# Patient Record
Sex: Female | Born: 1960 | Race: White | Hispanic: No | Marital: Married | Smoking: Never smoker
Health system: Southern US, Community
[De-identification: ages and names within clinical notes are randomized; demographics above are authoritative.]

## PROBLEM LIST (undated history)

## (undated) DIAGNOSIS — T4145XA Adverse effect of unspecified anesthetic, initial encounter: Secondary | ICD-10-CM

## (undated) DIAGNOSIS — C50919 Malignant neoplasm of unspecified site of unspecified female breast: Secondary | ICD-10-CM

## (undated) DIAGNOSIS — M8580 Other specified disorders of bone density and structure, unspecified site: Secondary | ICD-10-CM

## (undated) DIAGNOSIS — Z803 Family history of malignant neoplasm of breast: Secondary | ICD-10-CM

## (undated) DIAGNOSIS — E119 Type 2 diabetes mellitus without complications: Secondary | ICD-10-CM

## (undated) DIAGNOSIS — T8859XA Other complications of anesthesia, initial encounter: Secondary | ICD-10-CM

## (undated) DIAGNOSIS — M25559 Pain in unspecified hip: Secondary | ICD-10-CM

## (undated) DIAGNOSIS — J45909 Unspecified asthma, uncomplicated: Secondary | ICD-10-CM

## (undated) DIAGNOSIS — S52501A Unspecified fracture of the lower end of right radius, initial encounter for closed fracture: Secondary | ICD-10-CM

## (undated) DIAGNOSIS — E039 Hypothyroidism, unspecified: Secondary | ICD-10-CM

## (undated) DIAGNOSIS — F845 Asperger's syndrome: Secondary | ICD-10-CM

## (undated) DIAGNOSIS — M1990 Unspecified osteoarthritis, unspecified site: Secondary | ICD-10-CM

## (undated) DIAGNOSIS — G8929 Other chronic pain: Secondary | ICD-10-CM

## (undated) DIAGNOSIS — C801 Malignant (primary) neoplasm, unspecified: Secondary | ICD-10-CM

## (undated) DIAGNOSIS — E079 Disorder of thyroid, unspecified: Secondary | ICD-10-CM

## (undated) DIAGNOSIS — I10 Essential (primary) hypertension: Secondary | ICD-10-CM

## (undated) DIAGNOSIS — Z8041 Family history of malignant neoplasm of ovary: Secondary | ICD-10-CM

## (undated) DIAGNOSIS — F840 Autistic disorder: Secondary | ICD-10-CM

## (undated) DIAGNOSIS — E1140 Type 2 diabetes mellitus with diabetic neuropathy, unspecified: Secondary | ICD-10-CM

## (undated) DIAGNOSIS — E559 Vitamin D deficiency, unspecified: Secondary | ICD-10-CM

## (undated) DIAGNOSIS — M549 Dorsalgia, unspecified: Secondary | ICD-10-CM

## (undated) HISTORY — DX: Disorder of thyroid, unspecified: E07.9

## (undated) HISTORY — DX: Other specified disorders of bone density and structure, unspecified site: M85.80

## (undated) HISTORY — DX: Type 2 diabetes mellitus without complications: E11.9

## (undated) HISTORY — PX: CHOLECYSTECTOMY: SHX55

## (undated) HISTORY — DX: Malignant neoplasm of unspecified site of unspecified female breast: C50.919

## (undated) HISTORY — DX: Essential (primary) hypertension: I10

## (undated) HISTORY — DX: Family history of malignant neoplasm of breast: Z80.3

## (undated) HISTORY — DX: Vitamin D deficiency, unspecified: E55.9

## (undated) HISTORY — DX: Family history of malignant neoplasm of ovary: Z80.41

## (undated) HISTORY — PX: BREAST BIOPSY: SHX20

---

## 1998-02-23 ENCOUNTER — Other Ambulatory Visit: Admission: RE | Admit: 1998-02-23 | Discharge: 1998-02-23 | Payer: Self-pay | Admitting: Obstetrics & Gynecology

## 1998-07-30 ENCOUNTER — Encounter: Payer: Self-pay | Admitting: Emergency Medicine

## 1998-07-30 ENCOUNTER — Emergency Department (HOSPITAL_COMMUNITY): Admission: EM | Admit: 1998-07-30 | Discharge: 1998-07-30 | Payer: Self-pay | Admitting: *Deleted

## 1999-03-29 ENCOUNTER — Other Ambulatory Visit: Admission: RE | Admit: 1999-03-29 | Discharge: 1999-03-29 | Payer: Self-pay | Admitting: Obstetrics & Gynecology

## 2000-03-30 ENCOUNTER — Other Ambulatory Visit: Admission: RE | Admit: 2000-03-30 | Discharge: 2000-03-30 | Payer: Self-pay | Admitting: Obstetrics & Gynecology

## 2000-04-20 ENCOUNTER — Encounter: Admission: RE | Admit: 2000-04-20 | Discharge: 2000-04-20 | Payer: Self-pay | Admitting: Obstetrics & Gynecology

## 2000-04-20 ENCOUNTER — Encounter (INDEPENDENT_AMBULATORY_CARE_PROVIDER_SITE_OTHER): Payer: Self-pay | Admitting: Specialist

## 2000-04-20 ENCOUNTER — Encounter: Payer: Self-pay | Admitting: Obstetrics & Gynecology

## 2000-04-20 ENCOUNTER — Other Ambulatory Visit: Admission: RE | Admit: 2000-04-20 | Discharge: 2000-04-20 | Payer: Self-pay | Admitting: Obstetrics & Gynecology

## 2000-04-22 ENCOUNTER — Emergency Department (HOSPITAL_COMMUNITY): Admission: EM | Admit: 2000-04-22 | Discharge: 2000-04-22 | Payer: Self-pay | Admitting: Emergency Medicine

## 2001-04-23 ENCOUNTER — Other Ambulatory Visit: Admission: RE | Admit: 2001-04-23 | Discharge: 2001-04-23 | Payer: Self-pay | Admitting: Obstetrics & Gynecology

## 2001-05-28 ENCOUNTER — Emergency Department (HOSPITAL_COMMUNITY): Admission: EM | Admit: 2001-05-28 | Discharge: 2001-05-28 | Payer: Self-pay

## 2002-08-31 ENCOUNTER — Other Ambulatory Visit: Admission: RE | Admit: 2002-08-31 | Discharge: 2002-08-31 | Payer: Self-pay | Admitting: Obstetrics & Gynecology

## 2004-03-06 ENCOUNTER — Emergency Department (HOSPITAL_COMMUNITY): Admission: EM | Admit: 2004-03-06 | Discharge: 2004-03-06 | Payer: Self-pay | Admitting: Emergency Medicine

## 2004-04-02 ENCOUNTER — Ambulatory Visit (HOSPITAL_COMMUNITY): Admission: RE | Admit: 2004-04-02 | Discharge: 2004-04-03 | Payer: Self-pay | Admitting: General Surgery

## 2004-04-02 ENCOUNTER — Encounter (INDEPENDENT_AMBULATORY_CARE_PROVIDER_SITE_OTHER): Payer: Self-pay | Admitting: *Deleted

## 2005-03-26 ENCOUNTER — Other Ambulatory Visit: Admission: RE | Admit: 2005-03-26 | Discharge: 2005-03-26 | Payer: Self-pay | Admitting: Internal Medicine

## 2005-04-10 ENCOUNTER — Encounter: Admission: RE | Admit: 2005-04-10 | Discharge: 2005-04-10 | Payer: Self-pay | Admitting: Internal Medicine

## 2006-05-29 ENCOUNTER — Encounter: Admission: RE | Admit: 2006-05-29 | Discharge: 2006-05-29 | Payer: Self-pay | Admitting: Internal Medicine

## 2008-10-13 ENCOUNTER — Other Ambulatory Visit: Admission: RE | Admit: 2008-10-13 | Discharge: 2008-10-13 | Payer: Self-pay | Admitting: Internal Medicine

## 2009-04-14 ENCOUNTER — Encounter
Admission: RE | Admit: 2009-04-14 | Discharge: 2009-04-14 | Payer: Self-pay | Admitting: Physical Medicine and Rehabilitation

## 2010-07-31 ENCOUNTER — Other Ambulatory Visit: Payer: Self-pay | Admitting: Obstetrics and Gynecology

## 2010-08-02 NOTE — Op Note (Signed)
Tara Johnson                 ACCOUNT NO.:  [id number]   MEDICAL RECORD NO.:  [id number]          PATIENT TYPE:  OIB   LOCATION:  2872                         FACILITY:  [facility]   PHYSICIAN:  Anselm Pancoast. [place], M.D.DATE OF BIRTH:  [birth year]   DATE OF PROCEDURE:  04/02/2004  DATE OF DISCHARGE:                                 OPERATIVE REPORT   PREOPERATIVE DIAGNOSIS:  Chronic cholecystitis with stones.   POSTOPERATIVE DIAGNOSIS:  Chronic cholecystitis with stones.   PROCEDURE:  Laparoscopic cholecystectomy with cholangiogram.   ANESTHESIA:  General anesthesia.   SURGEON:  Anselm Pancoast. Zachery Dakins, M.D.   ASSISTANT:  Adolph Pollack, M.D.   HISTORY:  The patient is a 50 year old moderately overweight female who was  referred to me by Dr. Juleen Johnson for management of symptomatic gallstones.  She  was seen in the emergency room after a severe attack in the right upper  quadrant prior to Christmas and had an ultrasound that confirmed she had  gallstones.  She had a normal white count.  She was advised to see [place] in the  office and because of her scheduling, etc., she was not seen until recently.  When I saw her she said that she had been taking intermittent Vicodin pain  tablets and I recommended we proceed on fairly promptly with a laparoscopic  cholecystectomy and cholangiogram.  She is moderately overweight and on  blood pressure medication, but her liver tests and a CBC were unremarkable.   DESCRIPTION OF PROCEDURE:  Preoperatively she was given 3 grams of Unasyn.  She has PAS stockings and taken to the operative suite with induction of  general anesthesia endotracheal tube and oral tube and to the stomach.  The  abdomen was prepped with Betadine surgical scrub and solution and prepped in  a sterile manner.  A small incision was made below the umbilicus and  approximately three inches deep. The fascia was identified and this was  picked up between two Kochers and a small  opening made.  I thought I was in  the peritoneal cavity, but I was really in the preperitoneal fatty area.  I  placed a pursestring of 0 Vicryl and then inserted the cannula.  I noted  that we actually could see through the peritoneum, but then picked it up  between hemostats after taking the trocar out and then I was in the true  peritoneal cavity.  The Hasson cannula was reinserted.  The upper 10 mm  trocar was placed under direct vision and the two lateral 5 mm trocars were  placed after anesthetizing the fascia by Dr. Abbey Chatters.  The gallbladder  was tense but fortunately not acutely inflamed. There were adhesions around  it and these were kind of carefully taken down.  She was in a pretty steep  reverse Trendelenburg and tilted to the left. The proximal portion of the  gallbladder was freed.  The cystic duct was encompassed very carefully so  that a clip could be placed on the junction of the gallbladder and cystic  duct.  The cystic duct was probably about a  cm and a half in length.  A  small opening was made proximally, a Cook catheter introduced and x-rays  obtained which showed good prompt filling into the extrahepatic biliary  system and into the duodenum.  The intrahepatic radicals also filled.  I  then removed the catheter, triply clipped the little short cystic duct,  divided it and then the cystic artery was with a little lymph node and this  was kind of encompassed with the right angle and then triply clipped  proximally, singly distally and divided.  The remaining portion was kind of  carefully dissected since I was not sure there was not a posterior branch of  the cystic artery.  Good hemostasis was obtained as we were freeing the  distended gallbladder from its bed.  After the gallbladder was completely  freed, I did place it in an EndoCatch bag.  Good hemostasis and we irrigated  and aspirated.  We then brought the camera to the upper 10 mm port, withdrew  the neck of  the bag containing the stones up to skin level and then pulled  the gallbladder up, opened it and then crushed the numerous, fairly good  size stones so that I could bring them up through the fascia without  enlarging it.  The bag containing the gallstones and gallbladder was  withdrawn.  We then placed an additional figure-of-eight of 0 Vicryl in the  fascia and tied both.  A little irrigating fluid had been aspirated and then  we relieved the C02, removed the 5 mm port under direct vision, released all  the carbon dioxide and then withdrew the camera.  The subcutaneous wounds  were closed with 4-0 Vicryl and Benzoin and Steri-Strips on the skin.  The  patient tolerated the procedure nicely and was sent to the recovery room and  extubated in a satisfactory postoperative condition.  She will be followed  here this evening and released in the a.m.       WJW/MEDQ  D:  04/02/2004  T:  04/02/2004  Job:  [id number]   cc:   Brooke Bonito, M.D.  [address]  [place]  [place] [address]  Fax: [phone]

## 2010-09-12 ENCOUNTER — Other Ambulatory Visit: Payer: Self-pay | Admitting: Obstetrics and Gynecology

## 2010-12-19 ENCOUNTER — Other Ambulatory Visit: Payer: Self-pay | Admitting: Internal Medicine

## 2010-12-19 DIAGNOSIS — R519 Headache, unspecified: Secondary | ICD-10-CM

## 2010-12-19 DIAGNOSIS — R209 Unspecified disturbances of skin sensation: Secondary | ICD-10-CM

## 2010-12-22 ENCOUNTER — Ambulatory Visit
Admission: RE | Admit: 2010-12-22 | Discharge: 2010-12-22 | Disposition: A | Discharge status: Home / Self Care | Payer: Commercial Managed Care - PPO | Source: Ambulatory Visit | Attending: Internal Medicine | Admitting: Internal Medicine

## 2010-12-22 DIAGNOSIS — R519 Headache, unspecified: Secondary | ICD-10-CM

## 2010-12-22 DIAGNOSIS — R209 Unspecified disturbances of skin sensation: Secondary | ICD-10-CM

## 2011-12-01 ENCOUNTER — Other Ambulatory Visit: Payer: Self-pay | Admitting: Physical Medicine and Rehabilitation

## 2011-12-01 DIAGNOSIS — M545 Low back pain, unspecified: Secondary | ICD-10-CM

## 2011-12-01 DIAGNOSIS — M5126 Other intervertebral disc displacement, lumbar region: Secondary | ICD-10-CM

## 2011-12-03 ENCOUNTER — Ambulatory Visit
Admission: RE | Admit: 2011-12-03 | Discharge: 2011-12-03 | Disposition: A | Discharge status: Home / Self Care | Payer: Commercial Managed Care - PPO | Source: Ambulatory Visit | Attending: Physical Medicine and Rehabilitation | Admitting: Physical Medicine and Rehabilitation

## 2011-12-03 DIAGNOSIS — M545 Low back pain, unspecified: Secondary | ICD-10-CM

## 2011-12-03 DIAGNOSIS — M5126 Other intervertebral disc displacement, lumbar region: Secondary | ICD-10-CM

## 2012-10-29 ENCOUNTER — Ambulatory Visit (HOSPITAL_COMMUNITY)
Admission: RE | Admit: 2012-10-29 | Discharge: 2012-10-29 | Disposition: A | Discharge status: Home / Self Care | Payer: Commercial Managed Care - PPO | Source: Ambulatory Visit | Attending: Internal Medicine | Admitting: Internal Medicine

## 2012-10-29 ENCOUNTER — Other Ambulatory Visit (HOSPITAL_COMMUNITY): Payer: Self-pay | Admitting: Internal Medicine

## 2012-10-29 DIAGNOSIS — M7989 Other specified soft tissue disorders: Secondary | ICD-10-CM

## 2012-10-29 DIAGNOSIS — M545 Low back pain, unspecified: Secondary | ICD-10-CM | POA: Insufficient documentation

## 2012-10-29 DIAGNOSIS — E669 Obesity, unspecified: Secondary | ICD-10-CM | POA: Insufficient documentation

## 2012-10-29 DIAGNOSIS — M5430 Sciatica, unspecified side: Secondary | ICD-10-CM | POA: Insufficient documentation

## 2012-10-29 NOTE — Progress Notes (Signed)
VASCULAR LAB PRELIMINARY  PRELIMINARY  PRELIMINARY  PRELIMINARY  Left lower extremity venous duplex completed.    Preliminary report:  Left:  No evidence of DVT, superficial thrombosis, or Baker's cyst.  Kimyah Frein, RVS 10/29/2012, 1:25 PM

## 2012-12-27 ENCOUNTER — Other Ambulatory Visit: Payer: Self-pay | Admitting: Internal Medicine

## 2012-12-27 DIAGNOSIS — Z1231 Encounter for screening mammogram for malignant neoplasm of breast: Secondary | ICD-10-CM

## 2013-03-22 ENCOUNTER — Other Ambulatory Visit (HOSPITAL_COMMUNITY)
Admission: RE | Admit: 2013-03-22 | Discharge: 2013-03-22 | Disposition: A | Discharge status: Home / Self Care | Payer: Commercial Managed Care - PPO | Source: Ambulatory Visit | Attending: Obstetrics and Gynecology | Admitting: Obstetrics and Gynecology

## 2013-03-22 ENCOUNTER — Other Ambulatory Visit: Payer: Self-pay | Admitting: Obstetrics and Gynecology

## 2013-03-22 DIAGNOSIS — Z01419 Encounter for gynecological examination (general) (routine) without abnormal findings: Secondary | ICD-10-CM | POA: Insufficient documentation

## 2013-03-22 DIAGNOSIS — Z1151 Encounter for screening for human papillomavirus (HPV): Secondary | ICD-10-CM | POA: Insufficient documentation

## 2013-03-23 ENCOUNTER — Ambulatory Visit
Admission: RE | Admit: 2013-03-23 | Discharge: 2013-03-23 | Disposition: A | Discharge status: Home / Self Care | Payer: Commercial Managed Care - PPO | Source: Ambulatory Visit | Attending: Internal Medicine | Admitting: Internal Medicine

## 2013-03-23 DIAGNOSIS — Z1231 Encounter for screening mammogram for malignant neoplasm of breast: Secondary | ICD-10-CM

## 2014-04-03 ENCOUNTER — Other Ambulatory Visit (HOSPITAL_COMMUNITY)
Admission: RE | Admit: 2014-04-03 | Discharge: 2014-04-03 | Disposition: A | Discharge status: Home / Self Care | Payer: Commercial Managed Care - PPO | Source: Ambulatory Visit | Attending: Obstetrics and Gynecology | Admitting: Obstetrics and Gynecology

## 2014-04-03 ENCOUNTER — Other Ambulatory Visit: Payer: Self-pay | Admitting: Obstetrics and Gynecology

## 2014-04-03 DIAGNOSIS — Z01419 Encounter for gynecological examination (general) (routine) without abnormal findings: Secondary | ICD-10-CM | POA: Insufficient documentation

## 2014-04-04 LAB — CYTOLOGY - PAP

## 2014-04-19 ENCOUNTER — Other Ambulatory Visit: Payer: Self-pay | Admitting: Obstetrics and Gynecology

## 2015-04-12 ENCOUNTER — Ambulatory Visit: Payer: Commercial Managed Care - PPO | Admitting: Dietician

## 2015-04-23 ENCOUNTER — Encounter: Payer: Commercial Managed Care - PPO | Attending: Internal Medicine | Admitting: Dietician

## 2015-04-23 ENCOUNTER — Encounter: Payer: Self-pay | Admitting: Dietician

## 2015-04-23 VITALS — Ht 64.0 in | Wt 268.0 lb

## 2015-04-23 DIAGNOSIS — G8929 Other chronic pain: Secondary | ICD-10-CM | POA: Diagnosis not present

## 2015-04-23 DIAGNOSIS — M545 Low back pain: Secondary | ICD-10-CM | POA: Diagnosis not present

## 2015-04-23 DIAGNOSIS — E1165 Type 2 diabetes mellitus with hyperglycemia: Secondary | ICD-10-CM | POA: Insufficient documentation

## 2015-04-23 DIAGNOSIS — R634 Abnormal weight loss: Secondary | ICD-10-CM | POA: Insufficient documentation

## 2015-04-23 DIAGNOSIS — I10 Essential (primary) hypertension: Secondary | ICD-10-CM | POA: Diagnosis not present

## 2015-04-23 DIAGNOSIS — E039 Hypothyroidism, unspecified: Secondary | ICD-10-CM | POA: Diagnosis not present

## 2015-04-23 DIAGNOSIS — E119 Type 2 diabetes mellitus without complications: Secondary | ICD-10-CM

## 2015-04-23 DIAGNOSIS — E669 Obesity, unspecified: Secondary | ICD-10-CM

## 2015-04-23 NOTE — Patient Instructions (Signed)
Small portion protein with each meal and snack. Continue the exercise habit.  Do some form of exercise 10 minutes most days of the week and increase slowly to 30 minutes. Aim for 2-3 Carb Choices per meal (30-45 grams) +/- 1 either way  Aim for 0-1 Carbs per snack if hungry  Ask MD about your medication dose based on frequent low blood sugars.  Dr. Janene Harvey Program for Reversing Diabetes (book) [place] [employer] (website) www.pcrm.org

## 2015-04-23 NOTE — Progress Notes (Signed)
Diabetes Self-Management Education  Visit Type: First/Initial  Appt. Start Time: 1130 Appt. End Time: 1300  04/23/2015  Tara Johnson, identified by name and date of birth, is a 55 y.o. female with a diagnosis of Diabetes: Type 2 diagnosed in October 2016.  She reports hx of prediabetes for the past 20 years and Gestational Diabetes. Other hx includes hypothyroidism, HTN, and high functioning Autism per pt.  Her husband has diabetes and she is fairly educated regarding this.  She has read, "Diabetes A-Z".  She lost from 298 lbs when diagnosed to 268 lbs today due to problems tolerating Metformin, increased exercise and changes to diet.  She has stopped eating sweets and how has an increased desire for these.    Patient lives with her husband and daughter.  She is currently the only one working in the household and this is causing increased stress.  She does the shopping and cooking.  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 There is a treadmill and elliptical at work and employers have encouraged her to use this daily.  She has a recumbent bike at home.  She currently does not have the motivation or energy to exercise regularly.  She stopped eating meat 4 years ago after helping her daughter with a research paper about animal cruelty.    Weight hx: Low 180 lbs High 298 (October 2016) Current 268 lbs  ASSESSMENT  Height 5\' 4"  (1.626 m), weight 268 lb (121.564 kg). Body mass index is 45.98 kg/(m^2).      Diabetes Self-Management Education - 04/23/15 1204    Visit Information   Visit Type First/Initial   Initial Visit   Diabetes Type Type 2   Are you currently following a meal plan? Yes   What type of meal plan do you follow? vegetarian   Are you taking your medications as prescribed? Yes   Date Diagnosed 12/2014   Health Coping   How would you rate your overall health? Good   Psychosocial Assessment   Patient Belief/Attitude about  Diabetes Motivated to manage diabetes   Self-care barriers None   Self-management support Doctor's office;Family   Other persons present Patient   Patient Concerns Nutrition/Meal planning;Weight Control;Glycemic Control   Special Needs None   Preferred Learning Style No preference indicated   Learning Readiness Change in progress   How often do you need to have someone help you when you read instructions, pamphlets, or other written materials from your doctor or pharmacy? 1 - Never   What is the last grade level you completed in school? 3 years college   Complications   Last HgB A1C per patient/outside source 7.5 %  12/2014   How often do you check your blood sugar? --  0-5 times daily   Fasting Blood glucose range (mg/dL) 130-179;180-200   Postprandial Blood glucose range (mg/dL) 180-200   Number of hypoglycemic episodes per month 4   Can you tell when your blood sugar is low? Yes   What do you do if your blood sugar is low? glucose tabs   Number of hyperglycemic episodes per week 10   Can you tell when your blood sugar is high? Yes   What do you do if your blood sugar is high? exercise   Have you had a dilated eye exam in the past 12 months? Yes   Have you had a dental exam in the past 12 months? No   Are you  checking your feet? Yes   How many days per week are you checking your feet? 7   Dietary Intake   Breakfast Special K protein cereal, berries, 1% milk OR scrambled egg and [employer] raisin toast OR kefir and greek yogurt and [employer] raisin toast  7-7:30   Snack (morning) clementine   Lunch lentil soup, fruit OR leftovers, cucumber, mandarine oranges OR  rare vegetarian lean quisine  12-2   Snack (afternoon) kind bar OR unsweetened applesauce OR popcorn   Dinner collards, pinto's, potatoes with margarine, oranges OR vegeburger, white or brown rice, vegetables  6:30-7:30   Snack (evening) half peanut butter sandwich or yogurt or nothing   Beverage(s) water, 1% milk, diet soda, keifer,  unsweetened tea   Exercise   Exercise Type Light (walking / raking leaves)   How many days per week to you exercise? 4   How many minutes per day do you exercise? 10   Total minutes per week of exercise 40   Patient Education   Previous Diabetes Education No   Disease state  Definition of diabetes, type 1 and 2, and the diagnosis of diabetes;Factors that contribute to the development of diabetes;Explored patient's options for treatment of their diabetes   Nutrition management  Role of diet in the treatment of diabetes and the relationship between the three main macronutrients and blood glucose level;Food label reading, portion sizes and measuring food.;Meal options for control of blood glucose level and chronic complications.   Physical activity and exercise  Role of exercise on diabetes management, blood pressure control and cardiac health.   Monitoring Identified appropriate SMBG and/or A1C goals.   Chronic complications Relationship between chronic complications and blood glucose control;Dental care   Psychosocial adjustment Worked with patient to identify barriers to care and solutions;Role of stress on diabetes   Individualized Goals (developed by patient)   Nutrition General guidelines for healthy choices and portions discussed   Physical Activity Exercise 5-7 days per week;15 minutes per day   Medications take my medication as prescribed   Monitoring  test my blood glucose as discussed   Reducing Risk examine blood glucose patterns;treat hypoglycemia with 15 grams of carbs if blood glucose less than 70mg /dL   Outcomes   Expected Outcomes Demonstrated interest in learning. Expect positive outcomes   Future DMSE PRN   Program Status Not Completed      Individualized Plan for Diabetes Self-Management Training:   Learning Objective:  Patient will have a greater understanding of diabetes self-management. Patient education plan is to attend individual and/or group sessions per assessed  needs and concerns.   Plan:   Patient Instructions  Small portion protein with each meal and snack. Continue the exercise habit.  Do some form of exercise 10 minutes most days of the week and increase slowly to 30 minutes. Aim for 2-3 Carb Choices per meal (30-45 grams) +/- 1 either way  Aim for 0-1 Carbs per snack if hungry  Ask MD about your medication dose based on frequent low blood sugars.  Dr. Janene Harvey Program for Reversing Diabetes (book) [place] [employer] (website) www.pcrm.org   Expected Outcomes:  Demonstrated interest in learning. Expect positive outcomes  Education material provided: Living Well with Diabetes, Food label handouts, A1C conversion sheet, Meal plan card, My Plate, Snack sheet and Support group flyer  If problems or questions, patient to contact team via:  Phone and Email  Future DSME appointment: PRN

## 2015-04-24 ENCOUNTER — Other Ambulatory Visit (HOSPITAL_COMMUNITY)
Admission: RE | Admit: 2015-04-24 | Discharge: 2015-04-24 | Disposition: A | Discharge status: Home / Self Care | Payer: Commercial Managed Care - PPO | Source: Ambulatory Visit | Attending: Obstetrics and Gynecology | Admitting: Obstetrics and Gynecology

## 2015-04-24 ENCOUNTER — Other Ambulatory Visit: Payer: Self-pay | Admitting: Obstetrics and Gynecology

## 2015-04-24 DIAGNOSIS — Z01419 Encounter for gynecological examination (general) (routine) without abnormal findings: Secondary | ICD-10-CM | POA: Diagnosis not present

## 2015-04-25 ENCOUNTER — Other Ambulatory Visit: Payer: Self-pay

## 2015-04-25 DIAGNOSIS — Z1231 Encounter for screening mammogram for malignant neoplasm of breast: Secondary | ICD-10-CM

## 2015-04-25 LAB — CYTOLOGY - PAP

## 2015-05-08 ENCOUNTER — Ambulatory Visit
Admission: RE | Admit: 2015-05-08 | Discharge: 2015-05-08 | Disposition: A | Discharge status: Home / Self Care | Payer: Commercial Managed Care - PPO | Source: Ambulatory Visit

## 2015-05-08 DIAGNOSIS — Z1231 Encounter for screening mammogram for malignant neoplasm of breast: Secondary | ICD-10-CM

## 2015-07-19 ENCOUNTER — Ambulatory Visit
Admission: RE | Admit: 2015-07-19 | Discharge: 2015-07-19 | Disposition: A | Discharge status: Home / Self Care | Payer: Commercial Managed Care - PPO | Source: Ambulatory Visit | Attending: Anesthesiology | Admitting: Anesthesiology

## 2015-07-19 ENCOUNTER — Other Ambulatory Visit: Payer: Self-pay | Admitting: Anesthesiology

## 2015-07-19 DIAGNOSIS — M25552 Pain in left hip: Secondary | ICD-10-CM

## 2015-09-12 NOTE — Patient Instructions (Signed)
Your procedure is scheduled on:  Wednesday, September 26, 2015  Enter through the [employer] [facility] at: 12:30 PM  Pick up the phone at the desk and dial [phone].  Call this number if you have problems the morning of surgery: [phone].  Remember: Do NOT eat food:  After Midnight Tuesday, September 25, 2015  Do NOT drink clear liquids after:  10:00 AM day of surgery  Take these medicines the morning of surgery with a SIP OF WATER:  Levothyroxine, Lisinopril  Do NOT wear jewelry (body piercing), metal hair clips/bobby pins, make-up, or nail polish. Do NOT wear lotions, powders, or perfumes.  You may wear deodorant. Do NOT shave for 48 hours prior to surgery. Do NOT bring valuables to the hospital. Contacts, dentures, or bridgework may not be worn into surgery.  Have a responsible adult drive you home and stay with you for 24 hours after your procedure

## 2015-09-13 ENCOUNTER — Other Ambulatory Visit (HOSPITAL_COMMUNITY): Payer: Self-pay | Admitting: Obstetrics and Gynecology

## 2015-09-13 ENCOUNTER — Encounter (HOSPITAL_COMMUNITY): Payer: Self-pay

## 2015-09-13 ENCOUNTER — Encounter (HOSPITAL_COMMUNITY)
Admission: RE | Admit: 2015-09-13 | Discharge: 2015-09-13 | Disposition: A | Discharge status: Home / Self Care | Payer: Commercial Managed Care - PPO | Source: Ambulatory Visit | Attending: Obstetrics and Gynecology | Admitting: Obstetrics and Gynecology

## 2015-09-13 DIAGNOSIS — Z01812 Encounter for preprocedural laboratory examination: Secondary | ICD-10-CM | POA: Diagnosis not present

## 2015-09-13 HISTORY — DX: Unspecified asthma, uncomplicated: J45.909

## 2015-09-13 HISTORY — DX: Autistic disorder: F84.0

## 2015-09-13 HISTORY — DX: Dorsalgia, unspecified: M54.9

## 2015-09-13 HISTORY — DX: Unspecified osteoarthritis, unspecified site: M19.90

## 2015-09-13 HISTORY — DX: Asperger's syndrome: F84.5

## 2015-09-13 HISTORY — DX: Other complications of anesthesia, initial encounter: T88.59XA

## 2015-09-13 HISTORY — DX: Type 2 diabetes mellitus with diabetic neuropathy, unspecified: E11.40

## 2015-09-13 HISTORY — DX: Pain in unspecified hip: M25.559

## 2015-09-13 HISTORY — DX: Malignant (primary) neoplasm, unspecified: C80.1

## 2015-09-13 HISTORY — DX: Other chronic pain: G89.29

## 2015-09-13 HISTORY — DX: Adverse effect of unspecified anesthetic, initial encounter: T41.45XA

## 2015-09-13 HISTORY — DX: Hypothyroidism, unspecified: E03.9

## 2015-09-13 LAB — BASIC METABOLIC PANEL
Anion gap: 5 (ref 5–15)
BUN: 20 mg/dL (ref 6–20)
CO2: 28 mmol/L (ref 22–32)
Calcium: 9.4 mg/dL (ref 8.9–10.3)
Chloride: 107 mmol/L (ref 101–111)
Creatinine, Ser: 0.68 mg/dL (ref 0.44–1.00)
GFR calc Af Amer: >60 mL/min (ref >60)
GFR calc non Af Amer: >60 mL/min (ref >60)
Glucose, Bld: 58 mg/dL — ABNORMAL LOW (ref 65–99)
Potassium: 4.1 mmol/L (ref 3.5–5.1)
Sodium: 140 mmol/L (ref 135–145)

## 2015-09-13 LAB — CBC
HCT: 37.9 % (ref 36.0–46.0)
Hemoglobin: 12.6 g/dL (ref 12.0–15.0)
MCH: 30.3 pg (ref 26.0–34.0)
MCHC: 33.2 g/dL (ref 30.0–36.0)
MCV: 91.1 fL (ref 78.0–100.0)
Platelets: 289 10*3/uL (ref 150–400)
RBC: 4.16 MIL/uL (ref 3.87–5.11)
RDW: 15.9 % — ABNORMAL HIGH (ref 11.5–15.5)
WBC: 8.9 10*3/uL (ref 4.0–10.5)

## 2015-09-13 NOTE — Pre-Procedure Instructions (Signed)
Dr. Jillyn Hidden made aware of Tara Johnson glucose 58, no new orders received.  Also made Dr. Jillyn Hidden aware of patient's pain contract, he request a copy of the contract for the chart.

## 2015-09-19 ENCOUNTER — Other Ambulatory Visit (HOSPITAL_COMMUNITY): Payer: Self-pay | Admitting: Obstetrics and Gynecology

## 2015-09-19 NOTE — H&P (Signed)
Subjective: Chief Complaint(s):   PreOp for 09/26/15   HPI:  General 55 y/o presents for history and physical exam in preparation of hysteroscopy D& C and polypectomy. she has a known endometrial polyp. she had a benign endometrial biopsy 04/2014 that showed fragments of endomtrial polyp. she opted not to have surgery at that time. she reports spotting every other week. for 1-2 days. she denies pelvic pain. she is ready to proceed with hysteroscopy D&C and polypectomy.  Current Medication:  Taking  Flector(Diclofenac Epolamine) Patch 1 patch Transdermal twice a day as needed     Albuterol Sulfate HFA 108 (90 Base) MCG/ACT Aerosol Solution 2 puffs as needed Inhalation every 4 hrs, Notes: prn     Hydrocodone-Acetaminophen 10-300 MG Tablet 1 tablet Orally five times a day     Lyrica(Pregabalin) 100 MG Capsule 1 pm, 2 hs Orally , Notes: Dr Vira Blanco     Tizanidine HCl 2 MG Tablet 1 tablet as needed Orally at bedtime     Robaxin(Methocarbamol) 500 MG Tablet 1 tablet Orally every 4 hrs as needed, Notes: prn     Lisinopril 20 Tablet TAKE 1 TABLET BY MOUTH ONCE A DAY     Glimepiride 1 MG Tablet /21 tablet with breakfast or the first main meal of the day Orally Once a day     Levothyroxine Sodium 137 MCG Tablet 1 tablet on an empty stomach in the morning Orally Once a day   Not-Taking/PRN  One Touch Ultra Blue test strips dx E11.9 Test Strips For use when checking blood sugars Finger stick once daily, alternating mornings and evenings before meals & as directed     Medication List reviewed and reconciled with the patient   Medical History:   hypertension     hypothyroidism     diabetes mellitus II, 2016     sacral stress fracture 2008     lumbar/cervical DJD, chronic back and left leg pain, lumbar facet arthropathy, myofascial pain, possible neuropathic pain wong/spivey     osteopenia/vitamin d deficiency     obesity, morbid     erythema nodosum, 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 Periods : irregular. LMP 04/10/15 very light. Last pap smear date 04/24/15. Last mammogram date 05/08/15. Denies Abnormal pap smear no. H/O STD HPV, Condyloma. Menarche 6.   OB History:   Number of pregnancies 1. Pregnancy # 1 live birth, 06/1995, girl.   Surgical History:   cholecystectomy, [place] January 2006     endometrial biopsy, Syriah Delisi 2016   Hospitalization:   child birth 06/1995   Family History:   Father: deceased 52 yrs, Hypertension, Parkinson disease, possible late onset diabetes, diagnosed with DM, HTN    Mother: deceased 64 yrs, Ovarian cancer, OCD, Brain Cancer, osteoporosis, diagnosed with Ovary Ca    Brother 1: alive, Hypertension, glaucoma, gerd, OSA, diagnosed with HTN    Brother2: alive    Maternal aunt: Uterine cancer, osteoporosis    2 brother(s) . 1daughter(s) .   Social History:  General Tobacco use cigarettes: Never smoked, Tobacco history last updated 09/19/2015, Additional Findings: Tobacco Non-User Non-smoker for personal reasons.  no EXPOSURE TO  PASSIVE SMOKE.  Alcohol: yes, occasionally.  Caffeine: yes, coffee, very little, soda, tea, occasionally.  no Recreational drug use.  DIET: vegetarian.  Exercise: intermittent.  Marital Status: married, Married.  Children: 1 daughter.  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  ROS: CONSTITUTIONAL none" label="Fatigue" value="" options="no,yes" propid="91" itemid="[id number]" categoryid="10464" encounterid="[id number]"Fatigue none. none today" label="Fever" value="" options="no,yes" propid="91" itemid="10467" categoryid="10464" encounterid="[id number]"Fever none today.  CARDIOLOGY none" label="Chest pain" value=""  options="no,yes" propid="91" itemid="[id number]" categoryid="10488" encounterid="[id number]"Chest pain none.  RESPIRATORY no" label="Shortness of breath" value="" options="no" propid="91" itemid="270013" categoryid="138132" encounterid="[id number]"Shortness of breath no. no" label="Cough" value="" options="no,yes" propid="91" itemid="172745" categoryid="138132" encounterid="[id number]"Cough no.  GASTROENTEROLOGY none" label="Appetite change" value="" options="no,yes" propid="91" itemid="193447" categoryid="10494" encounterid="[id number]"Appetite change none. no" label="Change in bowel habits" value="" options="no,yes" propid="91" itemid="193449" categoryid="10494" encounterid="[id number]"Change in bowel habits no.  FEMALE REPRODUCTIVE no" label="Breast lumps or discharge" value="" options="no,yes" propid="91" itemid="196298" categoryid="10525" encounterid="[id number]"Breast lumps or discharge no. none" label="Breast pain" value="" options="no,yes" propid="91" itemid="186083" categoryid="10525" encounterid="[id number]"Breast pain none. none" label="Dyspareunia" value="" options="no,yes" propid="91" itemid="138198" categoryid="10525" encounterid="[id number]"Dyspareunia none. no" label="Dysuria" value="" options="no,yes" propid="91" itemid="202654" categoryid="10525" encounterid="[id number]"Dysuria no. none" label="Pelvic pain" value="" options="no,yes" propid="91" itemid="186082" categoryid="10525" encounterid="[id number]"Pelvic pain none. NA" label="Regular menses" value="" options="no,yes" propid="91" itemid="199173" categoryid="10525" encounterid="[id number]"Regular menses NA. no" label="Unusual vaginal discharge" value="" options="no,yes" propid="91" itemid="278230" categoryid="10525" encounterid="[id number]"Unusual vaginal discharge no. no" label="Vaginal itching" value="" options="no,yes" propid="91" itemid="278942" categoryid="10525" encounterid="[id number]"Vaginal itching no. no" label="Vulvar/labial lesion" value="" options="no,yes" propid="91"  itemid="278837" categoryid="10525" encounterid="[id number]"Vulvar/labial lesion no.  NEUROLOGY none" label="Migraines" value="" options="no,yes" propid="91" itemid="193627" categoryid="12512" encounterid="[id number]"Migraines none. none" label="Tingling/numbness" value="" options="no,yes" propid="91" itemid="12514" categoryid="12512" encounterid="[id number]"Tingling/numbness none. none" label="Visual changes" value="" options="no,yes" propid="91" itemid="193467" categoryid="12512" encounterid="[id number]"Visual changes none.  PSYCHOLOGY no" label="Depression" value="" options="" propid="91" itemid="275919" categoryid="10520" encounterid="[id number]"Depression no.  SKIN no" label="Rash" value="" options="no,yes" propid="91" itemid="269383" categoryid="202750" encounterid="[id number]"Rash no. no" label="Suspicious lesions" value="" options="no,yes" propid="91" itemid="202757" categoryid="202750" encounterid="[id number]"Suspicious lesions no.  ENDOCRINOLOGY none" label="Hot flashes" value="" options="no,yes" propid="91" itemid="202624" categoryid="12508" encounterid="[id number]"Hot flashes none. no unintentional" label="Weight gain" value="" options="no,yes" propid="91" itemid="193436" categoryid="12508" encounterid="[id number]"Weight gain no unintentional. none" label="Weight loss" value="" options="no,yes" propid="91" itemid="138164" categoryid="12508" encounterid="[id number]"Weight loss none.  HEMATOLOGY/LYMPH no" label="Anemia" value="" options="no,yes" propid="91" itemid="193454" categoryid="138157" encounterid="[id number]"Anemia no.    Objective: Vitals:  Wt 254, Wt change 5.4 lb, Pulse sitting 67, BP sitting 102/62  Past Results:  Examination:  General Examination McCoy,Tiffany 09/19/2015 12:24:15 PM &gt; , for pelvic exam only" label="CHAPERONE PRESENT" categoryPropId="[id number]" examid="[id number]"CHAPERONE PRESENT McCoy,Tiffany 09/19/2015 12:24:15 PM > , for pelvic exam only.  Physical Examination: GENERAL in NAD, pleasant"  label="Patient appears"Patient appears in NAD, pleasant. well developed" label="Build:"Build: well developed. overweight" label="General Appearance:"General Appearance: overweight. caucasian" label="Race:"Race: caucasian.  LUNGS clear to auscultation" label="Breath sounds:"Breath sounds: clear to auscultation. no" label="Dyspnea:"Dyspnea: no.  HEART none" label="Murmurs:"Murmurs: none. normal" label="Rate:"Rate: normal. regular" label="Rhythm:"Rhythm: regular.  ABDOMEN no masses,tenderness,organomegaly, no CVAT" label="General:"General: no masses,tenderness,organomegaly, no CVAT.  FEMALE GENITOURINARY no mass, non tender" label="Adnexa:"Adnexa: no mass, non tender. normal, no lesions" label="Anus/perineum:"Anus/perineum: normal, no lesions. normal appearance , no lesions/discharge/bleeding, good pelvic support , external os normal " label="Cervix/ cuff:"Cervix/ cuff: normal appearance , no lesions/discharge/bleeding, good pelvic support , external os normal . normal, no lesions, no skin discoloration, no lymphadenopathy" label="External genitalia:"External genitalia: normal, no lesions, no skin discoloration, no lymphadenopathy. normal external meatus" label="Urethra:"Urethra: normal external meatus. normal size/shape/consistency, freely mobile, non tender" label="Uterus:"Uterus: normal size/shape/consistency, freely mobile, non tender. deferred" label="Rectum:"Rectum: deferred. pink/moist mucosa, no lesions, no abnormal discharge, odorless" label="Vagina:"Vagina: pink/moist mucosa, no lesions, no abnormal discharge, odorless. normal, no lesions, no skin discoloration, non tender" label="Vulva:"Vulva: normal, no lesions, no skin discoloration, non tender.  EXTREMITIES FROM of all extremities" label="Extremities"Extremities FROM of all extremities.  NEUROLOGICAL normal" label="Gait:"Gait: normal. alert and oriented x 3" label="Orientation:"Orientation: alert and oriented x 3.    Assessment:  Assessment:  Postmenopausal bleeding - N95.0 (Primary)     Endometrial polyp - N84.0     Plan: Treatment:  Postmenopausal bleeding  Notes: hystereoscopy D&C possible polypectomy. Marland Kitchen rb/a of surgery discussed with the patient including but not limited to infection bleeding perforation of the uterus with the need for further surgery. pt voiced understanding and desires to proceed.  Endometrial polyp  Notes: hystereoscopy D&C possible polypectomy. Marland Kitchen rb/a of surgery discussed with the patient including but not limited to infection bleeding perforation of the uterus with the need for further surgery. pt voiced understanding and desires to proceed.

## 2015-09-26 ENCOUNTER — Encounter (HOSPITAL_COMMUNITY): Payer: Self-pay | Admitting: *Deleted

## 2015-09-26 ENCOUNTER — Encounter (HOSPITAL_COMMUNITY)
Admission: RE | Disposition: A | Discharge status: Home / Self Care | Payer: Self-pay | Source: Ambulatory Visit | Attending: Obstetrics and Gynecology

## 2015-09-26 ENCOUNTER — Ambulatory Visit (HOSPITAL_COMMUNITY): Payer: Commercial Managed Care - PPO | Admitting: Anesthesiology

## 2015-09-26 ENCOUNTER — Ambulatory Visit (HOSPITAL_COMMUNITY)
Admission: RE | Admit: 2015-09-26 | Discharge: 2015-09-26 | Disposition: A | Discharge status: Home / Self Care | Payer: Commercial Managed Care - PPO | Source: Ambulatory Visit | Attending: Obstetrics and Gynecology | Admitting: Obstetrics and Gynecology

## 2015-09-26 DIAGNOSIS — N840 Polyp of corpus uteri: Secondary | ICD-10-CM | POA: Diagnosis present

## 2015-09-26 DIAGNOSIS — Z6841 Body Mass Index (BMI) 40.0 and over, adult: Secondary | ICD-10-CM | POA: Insufficient documentation

## 2015-09-26 DIAGNOSIS — I10 Essential (primary) hypertension: Secondary | ICD-10-CM | POA: Diagnosis not present

## 2015-09-26 DIAGNOSIS — J45909 Unspecified asthma, uncomplicated: Secondary | ICD-10-CM | POA: Insufficient documentation

## 2015-09-26 DIAGNOSIS — Z7984 Long term (current) use of oral hypoglycemic drugs: Secondary | ICD-10-CM | POA: Diagnosis not present

## 2015-09-26 DIAGNOSIS — N950 Postmenopausal bleeding: Secondary | ICD-10-CM | POA: Diagnosis present

## 2015-09-26 DIAGNOSIS — E1140 Type 2 diabetes mellitus with diabetic neuropathy, unspecified: Secondary | ICD-10-CM | POA: Diagnosis not present

## 2015-09-26 HISTORY — PX: DILATATION & CURETTAGE/HYSTEROSCOPY WITH MYOSURE: SHX6511

## 2015-09-26 LAB — GLUCOSE, CAPILLARY
Glucose-Capillary: 93 mg/dL (ref 65–99)
Glucose-Capillary: 68 mg/dL (ref 65–99)
Glucose-Capillary: 128 mg/dL — ABNORMAL HIGH (ref 65–99)

## 2015-09-26 SURGERY — DILATATION & CURETTAGE/HYSTEROSCOPY WITH MYOSURE
Anesthesia: General | Site: Vagina

## 2015-09-26 MED ORDER — SILVER NITRATE-POT NITRATE 75-25 % EX MISC
CUTANEOUS | Status: DC | PRN
  Administered 2015-09-26: 2

## 2015-09-26 MED ORDER — FENTANYL CITRATE (PF) 100 MCG/2ML IJ SOLN
INTRAMUSCULAR | Status: AC
  Filled 2015-09-26: qty 2

## 2015-09-26 MED ORDER — SCOPOLAMINE 1 MG/3DAYS TD PT72
1.0000 | MEDICATED_PATCH | Freq: Once | TRANSDERMAL | Status: DC
  Administered 2015-09-26: 1.5 mg via TRANSDERMAL

## 2015-09-26 MED ORDER — SODIUM CHLORIDE 0.9 % IR SOLN
Status: DC | PRN
  Administered 2015-09-26: 3000 mL

## 2015-09-26 MED ORDER — LIDOCAINE HCL (CARDIAC) 20 MG/ML IV SOLN
INTRAVENOUS | Status: AC
  Filled 2015-09-26: qty 5

## 2015-09-26 MED ORDER — ONDANSETRON HCL 4 MG/2ML IJ SOLN
INTRAMUSCULAR | Status: AC
  Filled 2015-09-26: qty 2

## 2015-09-26 MED ORDER — LACTATED RINGERS IV SOLN
INTRAVENOUS | Status: DC
  Administered 2015-09-26: 13:00:00 via INTRAVENOUS

## 2015-09-26 MED ORDER — DEXAMETHASONE SODIUM PHOSPHATE 4 MG/ML IJ SOLN
INTRAMUSCULAR | Status: AC
  Filled 2015-09-26: qty 1

## 2015-09-26 MED ORDER — LIDOCAINE HCL (CARDIAC) 20 MG/ML IV SOLN
INTRAVENOUS | Status: DC | PRN
  Administered 2015-09-26: 50 mg via INTRAVENOUS

## 2015-09-26 MED ORDER — SILVER NITRATE-POT NITRATE 75-25 % EX MISC
CUTANEOUS | Status: AC
  Filled 2015-09-26: qty 1

## 2015-09-26 MED ORDER — BUPIVACAINE HCL (PF) 0.25 % IJ SOLN
INTRAMUSCULAR | Status: DC | PRN
  Administered 2015-09-26: 20 mL

## 2015-09-26 MED ORDER — ONDANSETRON HCL 4 MG/2ML IJ SOLN
INTRAMUSCULAR | Status: DC | PRN
  Administered 2015-09-26: 4 mg via INTRAVENOUS

## 2015-09-26 MED ORDER — OXYCODONE-ACETAMINOPHEN 5-325 MG PO TABS: 1.0000 | ORAL_TABLET | ORAL | Status: DC | PRN

## 2015-09-26 MED ORDER — MIDAZOLAM HCL 2 MG/2ML IJ SOLN
INTRAMUSCULAR | Status: AC
  Filled 2015-09-26: qty 2

## 2015-09-26 MED ORDER — SCOPOLAMINE 1 MG/3DAYS TD PT72
MEDICATED_PATCH | TRANSDERMAL | Status: AC
  Filled 2015-09-26: qty 1

## 2015-09-26 MED ORDER — IBUPROFEN 600 MG PO TABS: ORAL_TABLET | ORAL | Status: DC

## 2015-09-26 MED ORDER — DEXAMETHASONE SODIUM PHOSPHATE 4 MG/ML IJ SOLN
INTRAMUSCULAR | Status: DC | PRN
  Administered 2015-09-26: 4 mg via INTRAVENOUS

## 2015-09-26 MED ORDER — PROPOFOL 10 MG/ML IV BOLUS
INTRAVENOUS | Status: DC | PRN
  Administered 2015-09-26: 200 mg via INTRAVENOUS

## 2015-09-26 MED ORDER — KETOROLAC TROMETHAMINE 30 MG/ML IJ SOLN
INTRAMUSCULAR | Status: DC | PRN
  Administered 2015-09-26: 30 mg via INTRAVENOUS

## 2015-09-26 MED ORDER — PROPOFOL 10 MG/ML IV BOLUS
INTRAVENOUS | Status: AC
  Filled 2015-09-26: qty 20

## 2015-09-26 MED ORDER — MIDAZOLAM HCL 2 MG/2ML IJ SOLN
INTRAMUSCULAR | Status: DC | PRN
  Administered 2015-09-26: 2 mg via INTRAVENOUS

## 2015-09-26 MED ORDER — KETOROLAC TROMETHAMINE 30 MG/ML IJ SOLN
INTRAMUSCULAR | Status: AC
  Filled 2015-09-26: qty 1

## 2015-09-26 MED ORDER — BUPIVACAINE HCL (PF) 0.25 % IJ SOLN
INTRAMUSCULAR | Status: AC
  Filled 2015-09-26: qty 30

## 2015-09-26 MED ORDER — FENTANYL CITRATE (PF) 100 MCG/2ML IJ SOLN: 25.0000 ug | INTRAMUSCULAR | Status: DC | PRN

## 2015-09-26 MED ORDER — FENTANYL CITRATE (PF) 100 MCG/2ML IJ SOLN
INTRAMUSCULAR | Status: DC | PRN
  Administered 2015-09-26 (×2): 50 ug via INTRAVENOUS

## 2015-09-26 MED ORDER — PROMETHAZINE HCL 25 MG/ML IJ SOLN: 6.2500 mg | INTRAMUSCULAR | Status: DC | PRN

## 2015-09-26 SURGICAL SUPPLY — 23 items
CANISTER SUCT 3000ML (MISCELLANEOUS) ×3 IMPLANT
CATH ROBINSON RED A/P 16FR (CATHETERS) ×3 IMPLANT
CLOTH BEACON ORANGE TIMEOUT ST (SAFETY) ×3 IMPLANT
CONTAINER PREFILL 10% NBF 60ML (FORM) ×6 IMPLANT
DEVICE MYOSURE LITE (MISCELLANEOUS) ×2 IMPLANT
DEVICE MYOSURE REACH (MISCELLANEOUS) IMPLANT
DILATOR CANAL MILEX (MISCELLANEOUS) ×2 IMPLANT
ELECT REM PT RETURN 9FT ADLT (ELECTROSURGICAL) ×3
ELECTRODE REM PT RTRN 9FT ADLT (ELECTROSURGICAL) ×1 IMPLANT
FILTER ARTHROSCOPY CONVERTOR (FILTER) ×3 IMPLANT
GLOVE BIOGEL M 6.5 STRL (GLOVE) ×6 IMPLANT
GLOVE BIOGEL PI IND STRL 6.5 (GLOVE) ×1 IMPLANT
GLOVE BIOGEL PI IND STRL 7.0 (GLOVE) ×1 IMPLANT
GLOVE BIOGEL PI INDICATOR 6.5 (GLOVE) ×2
GLOVE BIOGEL PI INDICATOR 7.0 (GLOVE) ×2
GOWN STRL REUS W/TWL LRG LVL3 (GOWN DISPOSABLE) ×6 IMPLANT
PACK VAGINAL MINOR WOMEN LF (CUSTOM PROCEDURE TRAY) ×3 IMPLANT
PAD OB MATERNITY 4.3X12.25 (PERSONAL CARE ITEMS) ×3 IMPLANT
SEAL ROD LENS SCOPE MYOSURE (ABLATOR) ×3 IMPLANT
TOWEL OR 17X24 6PK STRL BLUE (TOWEL DISPOSABLE) ×6 IMPLANT
TUBING AQUILEX INFLOW (TUBING) ×3 IMPLANT
TUBING AQUILEX OUTFLOW (TUBING) ×3 IMPLANT
WATER STERILE IRR 1000ML POUR (IV SOLUTION) ×3 IMPLANT

## 2015-09-26 NOTE — Discharge Instructions (Signed)
DISCHARGE INSTRUCTIONS: HYSTEROSCOPY  The following instructions have been prepared to help you care for yourself upon your return home.  May Remove Scop patch on or before 09/29/15.  May take Ibuprofen after 7:45pm today.  May take stool softner while taking narcotic pain medication to prevent constipation.  Drink plenty of water.  Personal hygiene:  Use sanitary pads for vaginal drainage, not tampons.  Shower the day after your procedure.  NO tub baths, pools or Jacuzzis for 2-3 weeks.  Wipe front to back after using the bathroom.  Activity and limitations:  Do NOT drive or operate any equipment for 24 hours. The effects of anesthesia are still present and drowsiness may result.  Do NOT rest in bed all day.  Walking is encouraged.  Walk up and down stairs slowly.  You may resume your normal activity in one to two days or as indicated by your physician. Sexual activity: NO intercourse for at least 2 weeks after the procedure, or as indicated by your Doctor.  Diet: Eat a light meal as desired this evening. You may resume your usual diet tomorrow.  Return to Work: You may resume your work activities in one to two days or as indicated by Marine scientist.  What to expect after your surgery: Expect to have vaginal bleeding/discharge for 2-3 days and spotting for up to 10 days. It is not unusual to have soreness for up to 1-2 weeks. You may have a slight burning sensation when you urinate for the first day. Mild cramps may continue for a couple of days. You may have a regular period in 2-6 weeks.  Call your doctor for any of the following:  Excessive vaginal bleeding or clotting, saturating and changing one pad every hour.  Inability to urinate 6 hours after discharge from hospital.  Pain not relieved by pain medication.  Fever of 100.4 F or greater.  Unusual vaginal discharge or odor.  Return to office _________________Call for an appointment  ___________________ Patients signature: ______________________ Nurses signature ________________________  [facility] [phone]

## 2015-09-26 NOTE — Anesthesia Procedure Notes (Signed)
Procedure Name: LMA Insertion Date/Time: 09/26/2015 1:18 PM Performed by: Bufford Spikes Pre-anesthesia Checklist: Patient identified, Patient being monitored, Emergency Drugs available, Timeout performed and Suction available Patient Re-evaluated:Patient Re-evaluated prior to inductionOxygen Delivery Method: Circle system utilized Preoxygenation: Pre-oxygenation with 100% oxygen Intubation Type: IV induction Ventilation: Mask ventilation without difficulty LMA: LMA inserted LMA Size: 4.0 Tube type: Oral Number of attempts: 1 Placement Confirmation: positive ETCO2 and breath sounds checked- equal and bilateral Tube secured with: Tape Dental Injury: Teeth and Oropharynx as per pre-operative assessment

## 2015-09-26 NOTE — H&P (Signed)
Date of Initial H&P: 09/19/2015  History reviewed, patient examined, no change in status, stable for surgery.

## 2015-09-26 NOTE — Anesthesia Preprocedure Evaluation (Addendum)
Anesthesia Evaluation  Patient identified by MRN, date of birth, ID band Patient awake    Reviewed: Allergy & Precautions, NPO status , Patient's Chart, lab work & pertinent test results  History of Anesthesia Complications (+) PROLONGED EMERGENCE and history of anesthetic complications  Airway Mallampati: II  TM Distance: >3 FB Neck ROM: Full    Dental  (+) Teeth Intact, Dental Advisory Given, Poor Dentition, Chipped   Pulmonary asthma ,    Pulmonary exam normal breath sounds clear to auscultation       Cardiovascular hypertension, Pt. on medications Normal cardiovascular exam Rhythm:Regular Rate:Normal     Neuro/Psych PSYCHIATRIC DISORDERS (Autism) Diabetic neuropathy     GI/Hepatic negative GI ROS, Neg liver ROS,   Endo/Other  diabetes, Type 2, Oral Hypoglycemic AgentsHypothyroidism Morbid obesity  Renal/GU negative Renal ROS     Musculoskeletal  (+) Arthritis , Osteoarthritis,    Abdominal   Peds  Hematology negative hematology ROS (+)   Anesthesia Other Findings Day of surgery medications reviewed with the patient.  Reproductive/Obstetrics                            Anesthesia Physical Anesthesia Plan  ASA: II  Anesthesia Plan: General   Post-op Pain Management:    Induction: Intravenous  Airway Management Planned: LMA  Additional Equipment:   Intra-op Plan:   Post-operative Plan: Extubation in OR  Informed Consent: I have reviewed the patients History and Physical, chart, labs and discussed the procedure including the risks, benefits and alternatives for the proposed anesthesia with the patient or authorized representative who has indicated his/her understanding and acceptance.   Dental advisory given  Plan Discussed with: CRNA  Anesthesia Plan Comments: (Risks/benefits of general anesthesia discussed with patient including risk of damage to teeth, lips, gum, and  tongue, nausea/vomiting, allergic reactions to medications, and the possibility of heart attack, stroke and death.  All patient questions answered.  Patient wishes to proceed.)        Anesthesia Quick Evaluation

## 2015-09-26 NOTE — Anesthesia Postprocedure Evaluation (Signed)
Anesthesia Post Note  Patient: Tara Johnson  Procedure(s) Performed: Procedure(s) (LRB): DILATATION & CURETTAGE/HYSTEROSCOPY WITH MYOSURE (N/A)  Patient location during evaluation: PACU Anesthesia Type: General Level of consciousness: awake and alert and oriented Pain management: pain level controlled Vital Signs Assessment: post-procedure vital signs reviewed and stable Respiratory status: spontaneous breathing, nonlabored ventilation and respiratory function stable Cardiovascular status: blood pressure returned to baseline and stable Postop Assessment: no signs of nausea or vomiting Anesthetic complications: no     Last Vitals:  Filed Vitals:   09/26/15 1445 09/26/15 1500  BP: 121/76 136/81  Pulse: 90 82  Temp:    Resp: 15 21    Last Pain: There were no vitals filed for this visit. Pain Goal: Patients Stated Pain Goal: 4 (09/26/15 1219)               Ilya Ess A.

## 2015-09-26 NOTE — Transfer of Care (Signed)
Immediate Anesthesia Transfer of Care Note  Patient: Tara Johnson  Procedure(s) Performed: Procedure(s) with comments: DILATATION & CURETTAGE/HYSTEROSCOPY WITH MYOSURE (N/A) - Polypectomy  Patient Location: PACU  Anesthesia Type:General  Level of Consciousness: awake, alert  and oriented  Airway & Oxygen Therapy: Patient Spontanous Breathing and Patient connected to nasal cannula oxygen  Post-op Assessment: Report given to RN and Post -op Vital signs reviewed and stable  Post vital signs: Reviewed and stable  Last Vitals:  Filed Vitals:   09/26/15 1219  BP: 130/73  Temp: 36.8 C  Resp: 18    Last Pain: There were no vitals filed for this visit.    Patients Stated Pain Goal: 4 ([id number])  Complications: No apparent anesthesia complications

## 2015-09-26 NOTE — Brief Op Note (Signed)
09/26/2015  2:12 PM  PATIENT:  Tara Johnson  55 y.o. female  PRE-OPERATIVE DIAGNOSIS:  N84.0 Endometrial Polyp  POST-OPERATIVE DIAGNOSIS:  ENDOMETRIAL POLYP  PROCEDURE:  Procedure(s) with comments: DILATATION & CURETTAGE/HYSTEROSCOPY WITH MYOSURE (N/A) - Polypectomy  SURGEON:  Surgeon(s) and Role:    * Christophe Louis, MD - Primary  PHYSICIAN ASSISTANT:   ASSISTANTS: none   ANESTHESIA:   general  EBL:  Total I/O In: 750 [I.V.:750] Out: 210 [Urine:200; Blood:10]  BLOOD ADMINISTERED:none  DRAINS: none   LOCAL MEDICATIONS USED:  MARCAINE     SPECIMEN:  Source of Specimen:  endometrial polyps and currettings   DISPOSITION OF SPECIMEN:  PATHOLOGY  COUNTS:  YES  TOURNIQUET:  * No tourniquets in log *  DICTATION: .Dragon Dictation  PLAN OF CARE: Discharge to home after PACU  PATIENT DISPOSITION:  PACU - hemodynamically stable.   Delay start of Pharmacological VTE agent (>24hrs) due to surgical blood loss or risk of bleeding: not applicable  Findings: Normal appearing cervix and external genitalia.. Multiple endometrial polyps noted.   Procedure: Patient was taken to the operating room where she was placed under general anesthesia. She was placed in the dorsal lithotomy position. She was prepped and draped in the usual sterile fashion. A speculum was placed into the vaginal vault. The anterior lip of the cervix was grasped with a single-tooth tenaculum. Quarter percent Marcaine was injected at the 4 and 8:00 positions of the cervix. The cervix was then sounded to 8 cm. The cervix was dilated to approximately 6 mm.Myosure hysteroscope was inserted. The findings noted above. The polyps were resected with the [facility] lite blade.  The hysteroscope  was removed. A Sharp curet was introduced and endometrial curettings were obtained. The hysteroscope was then reinserted. There was no evidence of endometrial polyps or masses with reinsertion of the hysteroscope. There was no evidence of  perforation. Hysteroscope was then removed. The single-tooth tenaculum was removed from the anterior lip of the cervix. Excellent hemostasis was noted. The speculum was removed from the patient's vagina. She was awakened from anesthesia taken care to the recovery room awake and in stable condition. Sponge lap and needle counts were correct x2.   Normal Saline  deficit was 325cc.

## 2015-09-27 ENCOUNTER — Encounter (HOSPITAL_COMMUNITY): Payer: Self-pay | Admitting: Obstetrics and Gynecology

## 2015-10-16 NOTE — Op Note (Signed)
09/26/2015  6:23 PM  PATIENT:  Tara Johnson  55 y.o. female  PRE-OPERATIVE DIAGNOSIS:  N84.0 Endometrial Polyp  POST-OPERATIVE DIAGNOSIS:  ENDOMETRIAL POLYP  PROCEDURE:  Procedure(s) with comments: DILATATION & CURETTAGE/HYSTEROSCOPY WITH MYOSURE (N/A) - Polypectomy  SURGEON:  Surgeon(s) and Role:    * Christophe Louis, MD - Primary  PHYSICIAN ASSISTANT:   ASSISTANTS: none   ANESTHESIA:   general  EBL:  No intake/output data recorded.  BLOOD ADMINISTERED:none  DRAINS: none   LOCAL MEDICATIONS USED:  MARCAINE     SPECIMEN:  Source of Specimen:  endometrial currettings and polyp   DISPOSITION OF SPECIMEN:  PATHOLOGY  COUNTS:  YES  TOURNIQUET:  * No tourniquets in log *  DICTATION: .Dragon Dictation  PLAN OF CARE: Discharge to home after PACU  PATIENT DISPOSITION:  PACU - hemodynamically stable.   Delay start of Pharmacological VTE agent (>24hrs) due to surgical blood loss or risk of bleeding: not applicable  Findings: normal appearing cervix. Endometrial polyps noted   Procedure: Patient was taken to the operating room where she was placed under general anesthesia. She was placed in the dorsal lithotomy position. She was prepped and draped in the usual sterile fashion. A speculum was placed into the vaginal vault. The anterior lip of the cervix was grasped with a single-tooth tenaculum. Quarter percent Marcaine was injected at the 4 and 8:00 positions of the cervix. The cervix was then sounded to 8 cm. The cervix was dilated to approximately 6 mm. The myosure operative  hysteroscope was inserted. The findings noted above.  The myosure lite blade was introduced through the  [employer] . The endometrial polyp was removed without difficulty. .  The hysteroscope was removed. A sharp curette was introduced and endometrial curetting were obtained. The hysteroscope was reinserted.  There was no evidence of perforation. Hysteroscope was then removed. The single-tooth tenaculum was removed  from the anterior lip of the cervix. Patient was noted to have bleeding from the tenaculum site. Silver nitrate was applied and excellent hemostasis was noted. The speculum was removed from the patient's vagina. She was awakened from anesthesia taken care  To the recovery  room awake and in stable condition. Sponge lap and needle counts were correct x2.

## 2016-04-09 DIAGNOSIS — G894 Chronic pain syndrome: Secondary | ICD-10-CM | POA: Diagnosis not present

## 2016-04-09 DIAGNOSIS — M47817 Spondylosis without myelopathy or radiculopathy, lumbosacral region: Secondary | ICD-10-CM | POA: Diagnosis not present

## 2016-04-09 DIAGNOSIS — M5137 Other intervertebral disc degeneration, lumbosacral region: Secondary | ICD-10-CM | POA: Diagnosis not present

## 2016-05-05 ENCOUNTER — Other Ambulatory Visit (HOSPITAL_COMMUNITY)
Admission: RE | Admit: 2016-05-05 | Discharge: 2016-05-05 | Disposition: A | Discharge status: Home / Self Care | Payer: Commercial Managed Care - PPO | Source: Ambulatory Visit | Attending: Obstetrics and Gynecology | Admitting: Obstetrics and Gynecology

## 2016-05-05 ENCOUNTER — Other Ambulatory Visit: Payer: Self-pay | Admitting: Obstetrics and Gynecology

## 2016-05-05 DIAGNOSIS — Z01419 Encounter for gynecological examination (general) (routine) without abnormal findings: Secondary | ICD-10-CM | POA: Diagnosis not present

## 2016-05-05 DIAGNOSIS — Z1151 Encounter for screening for human papillomavirus (HPV): Secondary | ICD-10-CM | POA: Insufficient documentation

## 2016-05-07 LAB — CYTOLOGY - PAP
Diagnosis: NEGATIVE
HPV: NOT DETECTED

## 2016-05-08 DIAGNOSIS — M545 Low back pain: Secondary | ICD-10-CM | POA: Diagnosis not present

## 2016-05-08 DIAGNOSIS — M47816 Spondylosis without myelopathy or radiculopathy, lumbar region: Secondary | ICD-10-CM | POA: Diagnosis not present

## 2016-05-08 DIAGNOSIS — G894 Chronic pain syndrome: Secondary | ICD-10-CM | POA: Diagnosis not present

## 2016-06-03 ENCOUNTER — Other Ambulatory Visit: Payer: Self-pay | Admitting: Obstetrics and Gynecology

## 2016-06-03 DIAGNOSIS — Z1231 Encounter for screening mammogram for malignant neoplasm of breast: Secondary | ICD-10-CM

## 2016-06-04 DIAGNOSIS — M47816 Spondylosis without myelopathy or radiculopathy, lumbar region: Secondary | ICD-10-CM | POA: Diagnosis not present

## 2016-06-04 DIAGNOSIS — G894 Chronic pain syndrome: Secondary | ICD-10-CM | POA: Diagnosis not present

## 2016-06-04 DIAGNOSIS — M545 Low back pain: Secondary | ICD-10-CM | POA: Diagnosis not present

## 2016-06-27 DIAGNOSIS — D225 Melanocytic nevi of trunk: Secondary | ICD-10-CM | POA: Diagnosis not present

## 2016-06-27 DIAGNOSIS — D485 Neoplasm of uncertain behavior of skin: Secondary | ICD-10-CM | POA: Diagnosis not present

## 2016-06-27 DIAGNOSIS — L814 Other melanin hyperpigmentation: Secondary | ICD-10-CM | POA: Diagnosis not present

## 2016-06-27 DIAGNOSIS — L821 Other seborrheic keratosis: Secondary | ICD-10-CM | POA: Diagnosis not present

## 2016-07-02 DIAGNOSIS — G894 Chronic pain syndrome: Secondary | ICD-10-CM | POA: Diagnosis not present

## 2016-07-02 DIAGNOSIS — M47816 Spondylosis without myelopathy or radiculopathy, lumbar region: Secondary | ICD-10-CM | POA: Diagnosis not present

## 2016-07-02 DIAGNOSIS — M545 Low back pain: Secondary | ICD-10-CM | POA: Diagnosis not present

## 2016-07-03 ENCOUNTER — Ambulatory Visit
Admission: RE | Admit: 2016-07-03 | Discharge: 2016-07-03 | Disposition: A | Discharge status: Home / Self Care | Payer: Commercial Managed Care - PPO | Source: Ambulatory Visit | Attending: Obstetrics and Gynecology | Admitting: Obstetrics and Gynecology

## 2016-07-03 DIAGNOSIS — Z1231 Encounter for screening mammogram for malignant neoplasm of breast: Secondary | ICD-10-CM | POA: Diagnosis not present

## 2016-07-10 DIAGNOSIS — Z1211 Encounter for screening for malignant neoplasm of colon: Secondary | ICD-10-CM | POA: Diagnosis not present

## 2016-07-10 DIAGNOSIS — E119 Type 2 diabetes mellitus without complications: Secondary | ICD-10-CM | POA: Diagnosis not present

## 2016-07-10 DIAGNOSIS — I10 Essential (primary) hypertension: Secondary | ICD-10-CM | POA: Diagnosis not present

## 2016-07-23 DIAGNOSIS — K5909 Other constipation: Secondary | ICD-10-CM | POA: Diagnosis not present

## 2016-07-25 ENCOUNTER — Other Ambulatory Visit: Payer: Self-pay | Admitting: Pain Medicine

## 2016-07-25 DIAGNOSIS — M545 Low back pain: Secondary | ICD-10-CM

## 2016-07-31 DIAGNOSIS — G894 Chronic pain syndrome: Secondary | ICD-10-CM | POA: Diagnosis not present

## 2016-07-31 DIAGNOSIS — M545 Low back pain: Secondary | ICD-10-CM | POA: Diagnosis not present

## 2016-07-31 DIAGNOSIS — M47816 Spondylosis without myelopathy or radiculopathy, lumbar region: Secondary | ICD-10-CM | POA: Diagnosis not present

## 2016-08-28 DIAGNOSIS — G894 Chronic pain syndrome: Secondary | ICD-10-CM | POA: Diagnosis not present

## 2016-08-28 DIAGNOSIS — M545 Low back pain: Secondary | ICD-10-CM | POA: Diagnosis not present

## 2016-08-28 DIAGNOSIS — M47816 Spondylosis without myelopathy or radiculopathy, lumbar region: Secondary | ICD-10-CM | POA: Diagnosis not present

## 2016-09-01 DIAGNOSIS — Q438 Other specified congenital malformations of intestine: Secondary | ICD-10-CM | POA: Diagnosis not present

## 2016-09-01 DIAGNOSIS — Z1211 Encounter for screening for malignant neoplasm of colon: Secondary | ICD-10-CM | POA: Diagnosis not present

## 2016-09-01 DIAGNOSIS — D126 Benign neoplasm of colon, unspecified: Secondary | ICD-10-CM | POA: Diagnosis not present

## 2016-09-16 DIAGNOSIS — H524 Presbyopia: Secondary | ICD-10-CM | POA: Diagnosis not present

## 2016-09-16 DIAGNOSIS — E119 Type 2 diabetes mellitus without complications: Secondary | ICD-10-CM | POA: Diagnosis not present

## 2016-09-16 DIAGNOSIS — H2513 Age-related nuclear cataract, bilateral: Secondary | ICD-10-CM | POA: Diagnosis not present

## 2016-09-25 DIAGNOSIS — M47816 Spondylosis without myelopathy or radiculopathy, lumbar region: Secondary | ICD-10-CM | POA: Diagnosis not present

## 2016-09-25 DIAGNOSIS — M545 Low back pain: Secondary | ICD-10-CM | POA: Diagnosis not present

## 2016-09-25 DIAGNOSIS — G894 Chronic pain syndrome: Secondary | ICD-10-CM | POA: Diagnosis not present

## 2016-10-17 DIAGNOSIS — M67912 Unspecified disorder of synovium and tendon, left shoulder: Secondary | ICD-10-CM | POA: Diagnosis not present

## 2016-10-23 DIAGNOSIS — G894 Chronic pain syndrome: Secondary | ICD-10-CM | POA: Diagnosis not present

## 2016-10-23 DIAGNOSIS — M47816 Spondylosis without myelopathy or radiculopathy, lumbar region: Secondary | ICD-10-CM | POA: Diagnosis not present

## 2016-10-23 DIAGNOSIS — M545 Low back pain: Secondary | ICD-10-CM | POA: Diagnosis not present

## 2016-11-07 DIAGNOSIS — M67912 Unspecified disorder of synovium and tendon, left shoulder: Secondary | ICD-10-CM | POA: Diagnosis not present

## 2016-11-12 DIAGNOSIS — M25512 Pain in left shoulder: Secondary | ICD-10-CM | POA: Diagnosis not present

## 2016-11-12 DIAGNOSIS — M7502 Adhesive capsulitis of left shoulder: Secondary | ICD-10-CM | POA: Diagnosis not present

## 2016-11-12 DIAGNOSIS — M67912 Unspecified disorder of synovium and tendon, left shoulder: Secondary | ICD-10-CM | POA: Diagnosis not present

## 2016-11-20 DIAGNOSIS — M47816 Spondylosis without myelopathy or radiculopathy, lumbar region: Secondary | ICD-10-CM | POA: Diagnosis not present

## 2016-11-20 DIAGNOSIS — G894 Chronic pain syndrome: Secondary | ICD-10-CM | POA: Diagnosis not present

## 2016-11-20 DIAGNOSIS — M545 Low back pain: Secondary | ICD-10-CM | POA: Diagnosis not present

## 2016-11-20 DIAGNOSIS — M7502 Adhesive capsulitis of left shoulder: Secondary | ICD-10-CM | POA: Diagnosis not present

## 2016-11-20 DIAGNOSIS — M67912 Unspecified disorder of synovium and tendon, left shoulder: Secondary | ICD-10-CM | POA: Diagnosis not present

## 2016-11-20 DIAGNOSIS — M25512 Pain in left shoulder: Secondary | ICD-10-CM | POA: Diagnosis not present

## 2016-12-05 DIAGNOSIS — M7502 Adhesive capsulitis of left shoulder: Secondary | ICD-10-CM | POA: Diagnosis not present

## 2016-12-15 DIAGNOSIS — G894 Chronic pain syndrome: Secondary | ICD-10-CM | POA: Diagnosis not present

## 2016-12-15 DIAGNOSIS — M545 Low back pain: Secondary | ICD-10-CM | POA: Diagnosis not present

## 2016-12-15 DIAGNOSIS — M47816 Spondylosis without myelopathy or radiculopathy, lumbar region: Secondary | ICD-10-CM | POA: Diagnosis not present

## 2017-01-09 ENCOUNTER — Ambulatory Visit
Admission: RE | Admit: 2017-01-09 | Discharge: 2017-01-09 | Disposition: A | Discharge status: Home / Self Care | Payer: Commercial Managed Care - PPO | Source: Ambulatory Visit | Attending: Physician Assistant | Admitting: Physician Assistant

## 2017-01-09 ENCOUNTER — Other Ambulatory Visit: Payer: Self-pay | Admitting: Physician Assistant

## 2017-01-09 DIAGNOSIS — M545 Low back pain, unspecified: Secondary | ICD-10-CM

## 2017-01-09 DIAGNOSIS — M7502 Adhesive capsulitis of left shoulder: Secondary | ICD-10-CM | POA: Diagnosis not present

## 2017-01-09 DIAGNOSIS — G8929 Other chronic pain: Secondary | ICD-10-CM

## 2017-01-12 DIAGNOSIS — E119 Type 2 diabetes mellitus without complications: Secondary | ICD-10-CM | POA: Diagnosis not present

## 2017-01-12 DIAGNOSIS — I10 Essential (primary) hypertension: Secondary | ICD-10-CM | POA: Diagnosis not present

## 2017-01-12 DIAGNOSIS — M47816 Spondylosis without myelopathy or radiculopathy, lumbar region: Secondary | ICD-10-CM | POA: Diagnosis not present

## 2017-01-12 DIAGNOSIS — Z0000 Encounter for general adult medical examination without abnormal findings: Secondary | ICD-10-CM | POA: Diagnosis not present

## 2017-01-12 DIAGNOSIS — G894 Chronic pain syndrome: Secondary | ICD-10-CM | POA: Diagnosis not present

## 2017-01-12 DIAGNOSIS — M47817 Spondylosis without myelopathy or radiculopathy, lumbosacral region: Secondary | ICD-10-CM | POA: Diagnosis not present

## 2017-01-12 DIAGNOSIS — E039 Hypothyroidism, unspecified: Secondary | ICD-10-CM | POA: Diagnosis not present

## 2017-01-12 DIAGNOSIS — M545 Low back pain: Secondary | ICD-10-CM | POA: Diagnosis not present

## 2017-02-02 DIAGNOSIS — M47817 Spondylosis without myelopathy or radiculopathy, lumbosacral region: Secondary | ICD-10-CM | POA: Diagnosis not present

## 2017-02-13 DIAGNOSIS — G894 Chronic pain syndrome: Secondary | ICD-10-CM | POA: Diagnosis not present

## 2017-02-13 DIAGNOSIS — M545 Low back pain: Secondary | ICD-10-CM | POA: Diagnosis not present

## 2017-02-13 DIAGNOSIS — M47816 Spondylosis without myelopathy or radiculopathy, lumbar region: Secondary | ICD-10-CM | POA: Diagnosis not present

## 2017-05-07 DIAGNOSIS — Z01419 Encounter for gynecological examination (general) (routine) without abnormal findings: Secondary | ICD-10-CM | POA: Diagnosis not present

## 2017-06-18 DIAGNOSIS — J069 Acute upper respiratory infection, unspecified: Secondary | ICD-10-CM | POA: Diagnosis not present

## 2017-06-18 DIAGNOSIS — M549 Dorsalgia, unspecified: Secondary | ICD-10-CM | POA: Diagnosis not present

## 2017-07-14 DIAGNOSIS — J302 Other seasonal allergic rhinitis: Secondary | ICD-10-CM | POA: Diagnosis not present

## 2017-07-14 DIAGNOSIS — E119 Type 2 diabetes mellitus without complications: Secondary | ICD-10-CM | POA: Diagnosis not present

## 2017-07-14 DIAGNOSIS — I10 Essential (primary) hypertension: Secondary | ICD-10-CM | POA: Diagnosis not present

## 2018-01-14 DIAGNOSIS — E1169 Type 2 diabetes mellitus with other specified complication: Secondary | ICD-10-CM | POA: Diagnosis not present

## 2018-01-14 DIAGNOSIS — I10 Essential (primary) hypertension: Secondary | ICD-10-CM | POA: Diagnosis not present

## 2018-01-14 DIAGNOSIS — E039 Hypothyroidism, unspecified: Secondary | ICD-10-CM | POA: Diagnosis not present

## 2018-01-14 DIAGNOSIS — Z0000 Encounter for general adult medical examination without abnormal findings: Secondary | ICD-10-CM | POA: Diagnosis not present

## 2018-01-14 DIAGNOSIS — Z23 Encounter for immunization: Secondary | ICD-10-CM | POA: Diagnosis not present

## 2018-02-08 ENCOUNTER — Telehealth: Payer: Self-pay

## 2018-02-08 NOTE — Telephone Encounter (Signed)
SENT REFERRAL TO SCHEDULING AND FILED NOTES 

## 2018-04-04 ENCOUNTER — Emergency Department (HOSPITAL_BASED_OUTPATIENT_CLINIC_OR_DEPARTMENT_OTHER): Payer: 59

## 2018-04-04 ENCOUNTER — Encounter (HOSPITAL_BASED_OUTPATIENT_CLINIC_OR_DEPARTMENT_OTHER): Payer: Self-pay | Admitting: Adult Health

## 2018-04-04 ENCOUNTER — Other Ambulatory Visit: Payer: Self-pay

## 2018-04-04 ENCOUNTER — Emergency Department (HOSPITAL_BASED_OUTPATIENT_CLINIC_OR_DEPARTMENT_OTHER)
Admission: EM | Admit: 2018-04-04 | Discharge: 2018-04-04 | Disposition: A | Discharge status: Home / Self Care | Payer: 59 | Attending: Emergency Medicine | Admitting: Emergency Medicine

## 2018-04-04 DIAGNOSIS — M25562 Pain in left knee: Secondary | ICD-10-CM | POA: Insufficient documentation

## 2018-04-04 DIAGNOSIS — J45909 Unspecified asthma, uncomplicated: Secondary | ICD-10-CM | POA: Diagnosis not present

## 2018-04-04 DIAGNOSIS — E039 Hypothyroidism, unspecified: Secondary | ICD-10-CM | POA: Insufficient documentation

## 2018-04-04 DIAGNOSIS — S62101A Fracture of unspecified carpal bone, right wrist, initial encounter for closed fracture: Secondary | ICD-10-CM | POA: Diagnosis not present

## 2018-04-04 DIAGNOSIS — M25561 Pain in right knee: Secondary | ICD-10-CM | POA: Insufficient documentation

## 2018-04-04 DIAGNOSIS — W19XXXA Unspecified fall, initial encounter: Secondary | ICD-10-CM

## 2018-04-04 DIAGNOSIS — Y9301 Activity, walking, marching and hiking: Secondary | ICD-10-CM | POA: Diagnosis not present

## 2018-04-04 DIAGNOSIS — S01511A Laceration without foreign body of lip, initial encounter: Secondary | ICD-10-CM | POA: Insufficient documentation

## 2018-04-04 DIAGNOSIS — Y999 Unspecified external cause status: Secondary | ICD-10-CM | POA: Diagnosis not present

## 2018-04-04 DIAGNOSIS — Y9222 Religious institution as the place of occurrence of the external cause: Secondary | ICD-10-CM | POA: Insufficient documentation

## 2018-04-04 DIAGNOSIS — W108XXA Fall (on) (from) other stairs and steps, initial encounter: Secondary | ICD-10-CM | POA: Insufficient documentation

## 2018-04-04 DIAGNOSIS — E1140 Type 2 diabetes mellitus with diabetic neuropathy, unspecified: Secondary | ICD-10-CM | POA: Diagnosis not present

## 2018-04-04 DIAGNOSIS — R1084 Generalized abdominal pain: Secondary | ICD-10-CM | POA: Diagnosis not present

## 2018-04-04 DIAGNOSIS — I10 Essential (primary) hypertension: Secondary | ICD-10-CM | POA: Diagnosis not present

## 2018-04-04 DIAGNOSIS — S6991XA Unspecified injury of right wrist, hand and finger(s), initial encounter: Secondary | ICD-10-CM | POA: Diagnosis present

## 2018-04-04 DIAGNOSIS — R0789 Other chest pain: Secondary | ICD-10-CM

## 2018-04-04 LAB — COMPREHENSIVE METABOLIC PANEL
ALT: 26 U/L (ref 0–44)
AST: 29 U/L (ref 15–41)
Albumin: 4 g/dL (ref 3.5–5.0)
Alkaline Phosphatase: 67 U/L (ref 38–126)
Anion gap: 8 (ref 5–15)
BUN: 11 mg/dL (ref 6–20)
CO2: 23 mmol/L (ref 22–32)
Calcium: 8.9 mg/dL (ref 8.9–10.3)
Chloride: 106 mmol/L (ref 98–111)
Creatinine, Ser: 0.79 mg/dL (ref 0.44–1.00)
GFR calc Af Amer: >60 mL/min (ref >60)
GFR calc non Af Amer: >60 mL/min (ref >60)
Glucose, Bld: 208 mg/dL — ABNORMAL HIGH (ref 70–99)
Potassium: 3.9 mmol/L (ref 3.5–5.1)
Sodium: 137 mmol/L (ref 135–145)
Total Bilirubin: 0.4 mg/dL (ref 0.3–1.2)
Total Protein: 7.3 g/dL (ref 6.5–8.1)

## 2018-04-04 LAB — URINALYSIS, ROUTINE W REFLEX MICROSCOPIC
Bilirubin Urine: NEGATIVE
Glucose, UA: NEGATIVE mg/dL
Hgb urine dipstick: NEGATIVE
Ketones, ur: NEGATIVE mg/dL
Leukocytes, UA: NEGATIVE
Nitrite: NEGATIVE
Protein, ur: NEGATIVE mg/dL
Specific Gravity, Urine: <1.005 — ABNORMAL LOW (ref 1.005–1.030)
pH: 7 (ref 5.0–8.0)

## 2018-04-04 LAB — CBC WITH DIFFERENTIAL/PLATELET
Abs Immature Granulocytes: 0.06 10*3/uL (ref 0.00–0.07)
Basophils Absolute: 0.1 10*3/uL (ref 0.0–0.1)
Basophils Relative: 1 %
Eosinophils Absolute: 0.3 10*3/uL (ref 0.0–0.5)
Eosinophils Relative: 3 %
HCT: 47 % — ABNORMAL HIGH (ref 36.0–46.0)
Hemoglobin: 15.2 g/dL — ABNORMAL HIGH (ref 12.0–15.0)
Immature Granulocytes: 1 %
Lymphocytes Relative: 16 %
Lymphs Abs: 1.2 10*3/uL (ref 0.7–4.0)
MCH: 28.8 pg (ref 26.0–34.0)
MCHC: 32.3 g/dL (ref 30.0–36.0)
MCV: 89 fL (ref 80.0–100.0)
Monocytes Absolute: 0.4 10*3/uL (ref 0.1–1.0)
Monocytes Relative: 6 %
Neutro Abs: 5.7 10*3/uL (ref 1.7–7.7)
Neutrophils Relative %: 73 %
Platelets: 242 10*3/uL (ref 150–400)
RBC: 5.28 MIL/uL — ABNORMAL HIGH (ref 3.87–5.11)
RDW: 13.8 % (ref 11.5–15.5)
WBC: 7.8 10*3/uL (ref 4.0–10.5)
nRBC: 0 % (ref 0.0–0.2)

## 2018-04-04 LAB — LIPASE, BLOOD: Lipase: 36 U/L (ref 11–51)

## 2018-04-04 MED ORDER — HYDROCODONE-ACETAMINOPHEN 5-325 MG PO TABS: 1.0000 | ORAL_TABLET | Freq: Four times a day (QID) | ORAL | 0 refills | Status: DC | PRN

## 2018-04-04 MED ORDER — IBUPROFEN 600 MG PO TABS: 600.0000 mg | ORAL_TABLET | Freq: Four times a day (QID) | ORAL | 0 refills | Status: DC | PRN

## 2018-04-04 MED ORDER — FENTANYL CITRATE (PF) 100 MCG/2ML IJ SOLN
50.0000 ug | Freq: Once | INTRAMUSCULAR | Status: AC
  Administered 2018-04-04: 50 ug via INTRAVENOUS
  Filled 2018-04-04: qty 2

## 2018-04-04 MED ORDER — IOPAMIDOL (ISOVUE-300) INJECTION 61%
100.0000 mL | Freq: Once | INTRAVENOUS | Status: AC | PRN
  Administered 2018-04-04: 100 mL via INTRAVENOUS

## 2018-04-04 NOTE — Discharge Instructions (Addendum)
You have a fracture of your right wrist, you will need to remain in splint and use ibuprofen every 6 hours and pain medication as needed for breakthrough pain.  Ice and elevate the wrist.  Your work-up was otherwise reassuring, no evidence of rib fractures or injury to the chest or abdomen and x-rays of your knees show no evidence of acute fracture.  You will likely be sore over the next few days and this should slowly improve.  Please call to schedule follow-up appointment with Dr. Apolonio Schneiders with hand surgery or at your prior orthopedics office.  Return to the emergency department if you have significantly worsened pain, numbness or discoloration in the fingers, shortness of breath, cough, fever, or any other new or concerning symptoms.

## 2018-04-04 NOTE — ED Notes (Signed)
PT in CT and stating she can't continue with images due to the pain. New orders received for pain control.

## 2018-04-04 NOTE — ED Notes (Signed)
ED Provider at bedside. 

## 2018-04-04 NOTE — ED Triage Notes (Signed)
Presents post fall after missing a step while ambulating down the steps at [place] , she fell onto her knees and then hit her right wrist and her moth on the front of a pew. She is hurting right wrist, entire right side of her chest and abdomen, left knee and mouth. Denies LOC

## 2018-04-04 NOTE — ED Provider Notes (Signed)
[facility] EMERGENCY DEPARTMENT Provider Note   CSN: [id number] Arrival date & time: 04/04/18  1208     History   Chief Complaint Chief Complaint  Patient presents with  . Fall    HPI Tara Johnson is a 58 y.o. female.  Tara Johnson is a 58 y.o. female with a history of diabetes, hypertension, hypothyroidism, osteopenia, chronic back pain, asthma, arthritis and Asperger's syndrome, who presents to the emergency department via EMS for evaluation after a fall at church.  Patient reports she missed a step and fell forward landing on her knees and hitting the left side of her body on a bench and injuring her right wrist.  Patient also reports she hit her mouth on the edge of the pew.  She did not hit her head anywhere else did not have any loss of consciousness.  Did not fall on her back at all and is not having any neck or back pain.  Reports she had a small amount of bleeding from her lip initially but did not lose any teeth, reports she already has multiple broken teeth and missing teeth but none of this is new from today.  She reports her jaw feels like it fits together normally.  She is primarily complaining of pain throughout her chest and abdomen where she hit the bench, and pain in her right wrist, there is some swelling and deformity noted.  She also reports pain in both of her knees, worse on the left than the right, she had difficulty bearing weight due to pain in the knees with EMS.  No pain in her hips or pelvis, no pain in her ankles or feet.  She has not had anything for pain prior to arrival with EMS, no cuts or lacerations.  No other aggravating or alleviating factors.     Past Medical History:  Diagnosis Date  . Arthritis    lower back and bilateral knees  . Asperger's syndrome    High functioning  . Asthma    associated with upper respiratory infection use inhaler as needed  . Autism    high functioning  . Cancer ([place])    precancerous lesions on skin 20  plus  . Chronic back pain   . Complication of anesthesia    prolonged sedation  . Diabetes mellitus without complication ([place])   . Diabetic neuropathy ([place])   . Hip pain   . Hypertension   . Hypothyroidism   . Osteopenia   . Thyroid disease   . Vitamin D deficiency     Patient Active Problem List   Diagnosis Date Noted  . Postmenopausal bleeding 09/26/2015  . Endometrial polyp 09/26/2015    Past Surgical History:  Procedure Laterality Date  . CHOLECYSTECTOMY    . DILATATION & CURETTAGE/HYSTEROSCOPY WITH MYOSURE N/A 09/26/2015   Procedure: DILATATION & CURETTAGE/HYSTEROSCOPY WITH MYOSURE;  Surgeon: Christophe Louis, MD;  Location: [facility];  Service: Gynecology;  Laterality: N/A;  Polypectomy     OB History   No obstetric history on file.      Home Medications    Prior to Admission medications   Medication Sig Start Date End Date Taking? Authorizing Provider  glimepiride (AMARYL) 2 MG tablet Take 1 mg by mouth daily with breakfast.    Yes [provider]  levothyroxine (SYNTHROID, LEVOTHROID) 137 MCG tablet Take 137 mcg by mouth daily before breakfast.   Yes [provider]  lisinopril (PRINIVIL,ZESTRIL) 20 MG tablet Take 20 mg by mouth  daily.   Yes [provider]  glucose blood test strip 1 each by Other route as needed for other. Use as instructed    [provider]  ibuprofen (ADVIL,MOTRIN) 600 MG tablet 1 by mouth every 6 hours as needed 09/26/15   Christophe Louis, MD  oxyCODONE-acetaminophen (ROXICET) 5-325 MG tablet Take 1-2 tablets by mouth every 4 (four) hours as needed for severe pain. 09/26/15   Christophe Louis, MD  pregabalin (LYRICA) 100 MG capsule Take 100 mg by mouth every evening.    [provider]  tiZANidine (ZANAFLEX) 2 MG tablet Take 4 mg by mouth at bedtime.     [provider]    Family History Family History  Problem Relation Age of Onset  . Breast cancer Cousin     Social History Social History   Tobacco  Use  . Smoking status: Never Smoker  . Smokeless tobacco: Never Used  Substance Use Topics  . Alcohol use: Yes    Comment: moderately  . Drug use: No     Allergies   Metformin and related   Review of Systems Review of Systems  Constitutional: Negative for chills and fever.  HENT: Negative.   Eyes: Negative for visual disturbance.  Respiratory: Negative for cough and shortness of breath.   Cardiovascular: Positive for chest pain.  Gastrointestinal: Positive for abdominal pain. Negative for constipation, diarrhea, nausea and vomiting.  Genitourinary: Positive for flank pain. Negative for dysuria, frequency and hematuria.  Musculoskeletal: Positive for arthralgias and joint swelling. Negative for back pain and neck pain.  Skin: Negative for color change and rash.  Neurological: Negative for dizziness, weakness, light-headedness, numbness and headaches.     Physical Exam Updated Vital Signs BP (!) 165/108 (BP Location: Right Arm)   Pulse 83   Temp 98.2 F (36.8 C) (Oral)   Resp 18   Ht 5\' 4"  (1.626 m)   Wt 131.5 kg   SpO2 95%   BMI 49.78 kg/m   Physical Exam Vitals signs and nursing note reviewed.  Constitutional:      General: She is not in acute distress.    Appearance: Normal appearance. She is obese. She is not toxic-appearing or diaphoretic.     Comments: Patient appears uncomfortable and in pain but is in no acute distress.  HENT:     Head: Normocephalic.     Comments: Superficial laceration to the upper lip with small amount of dried blood and no active bleeding, no bony tenderness over the face, no palpable scalp hematoma or step-off, no appreciable deformity, negative battle sign, no raccoon eyes.    Right Ear: Tympanic membrane and ear canal normal.     Left Ear: Tympanic membrane and ear canal normal.     Nose: Nose normal.     Mouth/Throat:     Mouth: Mucous membranes are moist.     Comments: Mild superficial laceration to the upper lip, multiple broken  and missing teeth which patient reports is not new, no teeth are loose or tender to palpation on my exam, no evidence of alveolar ridge fracture, no malocclusion of the jaw or tenderness with movement of the jaw.  Do not feel that lip laceration is deep enough to require repair, there is no active bleeding. Eyes:     General:        Left eye: No discharge.     Extraocular Movements: Extraocular movements intact.     Conjunctiva/sclera: Conjunctivae normal.     Pupils: Pupils are  equal, round, and reactive to light.  Neck:     Musculoskeletal: Neck supple.     Comments: No midline C-spine tenderness. Cardiovascular:     Rate and Rhythm: Normal rate and regular rhythm.     Pulses: Normal pulses.     Heart sounds: Normal heart sounds. No murmur. No friction rub. No gallop.   Pulmonary:     Effort: Pulmonary effort is normal. No respiratory distress.     Breath sounds: Normal breath sounds. No stridor. No wheezing, rhonchi or rales.     Comments: Respirations equal and unlabored, patient able to speak in full sentences, lungs clear to auscultation bilaterally. Diffuse tenderness even to light touch throughout the chest with, patient yells out in pain with minimal palpation.  There is no palpable deformity or overlying ecchymosis or skin changes, no palpable crepitus, no flail chest.  Patient with bilateral chest expansion. Chest:     Chest wall: Tenderness present.  Abdominal:     General: Abdomen is flat. Bowel sounds are normal. There is no distension.     Palpations: Abdomen is soft. There is no mass.     Tenderness: There is abdominal tenderness. There is guarding. There is no left CVA tenderness or rebound.     Comments: Abdomen is soft, nondistended and bowel sounds present throughout, there is diffuse tenderness across the upper abdomen with some voluntary guarding, no rigidity or rebound tenderness and no lower abdominal tenderness, no tenderness over bilateral flanks.  No overlying  ecchymosis and no palpable deformity.  Musculoskeletal:        General: Tenderness and deformity present.     Comments: Tenderness and swelling over the right wrist with noted deformity, no obvious angulation.  2+ radial pulse and good capillary refill throughout the hand, normal sensation, range of motion limited by pain.  No tenderness at the elbow or shoulder. No pain or deformity throughout the left upper extremity. Pain or tenderness over the hips, stable pelvis on palpation. Bilateral knees tender to palpation over the anterior aspect, no overlying ecchymosis, abrasion or skin changes, range of motion limited by pain but there is no obvious deformity and patient is able to flex and extend bilateral knee somewhat.  No pain at the ankles.  2+ DP and TP pulses bilaterally with good capillary refill, normal sensation and strength. There is no midline thoracic or lumbar tenderness. All other joints supple and easily movable.  All compartments soft.  Skin:    General: Skin is warm and dry.     Capillary Refill: Capillary refill takes less than 2 seconds.  Neurological:     Mental Status: She is alert.     Comments: Speech is clear, able to follow commands CN III-XII intact Normal strength in upper and lower extremities bilaterally including dorsiflexion and plantar flexion, strong and equal grip strength Sensation normal to light and sharp touch Moves extremities without ataxia, coordination intact  Psychiatric:        Mood and Affect: Mood is anxious.      ED Treatments / Results  Labs (all labs ordered are listed, but only abnormal results are displayed) Labs Reviewed  COMPREHENSIVE METABOLIC PANEL - Abnormal; Notable for the following components:      Result Value   Glucose, Bld 208 (*)    All other components within normal limits  CBC WITH DIFFERENTIAL/PLATELET - Abnormal; Notable for the following components:   RBC 5.28 (*)    Hemoglobin 15.2 (*)    HCT 47.0 (*)  All other  components within normal limits  URINALYSIS, ROUTINE W REFLEX MICROSCOPIC - Abnormal; Notable for the following components:   Specific Gravity, Urine <1.005 (*)    All other components within normal limits  LIPASE, BLOOD    EKG None  Radiology Dg Wrist Complete Right  Result Date: 04/04/2018 CLINICAL DATA:  Recent fall with wrist pain, initial encounter EXAM: RIGHT WRIST - COMPLETE 3+ VIEW COMPARISON:  None. FINDINGS: Transverse fracture through the distal radial metaphysis is noted. Associated ulnar styloid fracture is seen. Impaction and posterior angulation is noted at the fracture site. Extension of the radial fracture into the articular surface is seen. Generalized soft tissue swelling is noted. IMPRESSION: Comminuted distal radial fracture with posterior angulation and mild impaction at the fracture site. Intra-articular involvement is noted. Minimally displaced ulnar styloid fracture. Electronically Signed   By: Inez Catalina M.D.   On: 04/04/2018 15:41   Ct Chest W Contrast  Result Date: 04/04/2018 CLINICAL DATA:  58 year old fell down steps today. Chest trauma. Bilateral rib pain and difficulty breathing. EXAM: CT CHEST, ABDOMEN, AND PELVIS WITH CONTRAST TECHNIQUE: Multidetector CT imaging of the chest, abdomen and pelvis was performed following the standard protocol during bolus administration of intravenous contrast. CONTRAST:  131mL ISOVUE-300 IOPAMIDOL (ISOVUE-300) INJECTION 61% COMPARISON:  Chest radiograph 08/14/2013 FINDINGS: CT CHEST FINDINGS Cardiovascular: Heart size is within normal limits without significant pericardial fluid. Normal caliber of the thoracic aorta. Main pulmonary arteries are patent. Motion artifact in the chest. Mediastinum/Nodes: No chest lymphadenopathy. Negative for mediastinal hematoma. Small amount of gas in the esophagus. No axillary lymph node enlargement. No significant lymph node enlargement in the lower chest. Thyroid tissue is poorly characterized.  Lungs/Pleura: No large pleural effusions. Significant motion artifact limits evaluation for subtle abnormalities. Focal parenchymal densities in the central aspect of the lingula on sequence 4, image 58 which may represent atelectasis. Otherwise, no significant airspace disease or consolidation in the lungs. Musculoskeletal: Both shoulders are located. No evidence for a displaced rib fracture. Thoracic vertebral body heights are maintained. Dense calcification in the right breast tissue. CT ABDOMEN PELVIS FINDINGS Hepatobiliary: Cholecystectomy. No acute abnormality involving the liver. Portal venous system is patent. Small calcification along the anterior dome. Pancreas: Limited evaluation due to motion artifact but no gross abnormality. Spleen: Normal in size without focal abnormality. Adrenals/Urinary Tract: Normal adrenal glands. Normal appearance of both kidneys without hydronephrosis. No suspicious renal lesions. Urinary bladder is unremarkable. Stomach/Bowel: Diverticulosis in the sigmoid colon without acute inflammatory changes. Normal appendix. No evidence for bowel obstruction or inflammation. Vascular/Lymphatic: No significant vascular findings are present. No enlarged abdominal or pelvic lymph nodes. Reproductive: Nodular structure along the posterior aspect of the uterus probably represents a fibroid. Otherwise, the uterus and adnexal structures are unremarkable. Other: Negative for free fluid. Negative for free air. Small umbilical hernia containing fat. Musculoskeletal: Degenerative facet disease in lower lumbar spine. The lumbar vertebral body heights are maintained. Mild disc space narrowing at L1-L2. IMPRESSION: 1. No acute traumatic injury in the chest, abdomen or pelvis. 2. Few densities in the lingula probably represent atelectasis but limited evaluation of the lungs due to excessive motion artifact. Negative for a pneumothorax. 3. Probable uterine fibroid. Electronically Signed   By: Markus Daft  M.D.   On: 04/04/2018 15:27   Ct Abdomen Pelvis W Contrast  Result Date: 04/04/2018 CLINICAL DATA:  58 year old fell down steps today. Chest trauma. Bilateral rib pain and difficulty breathing. EXAM: CT CHEST, ABDOMEN, AND PELVIS WITH CONTRAST TECHNIQUE: Multidetector  CT imaging of the chest, abdomen and pelvis was performed following the standard protocol during bolus administration of intravenous contrast. CONTRAST:  170mL ISOVUE-300 IOPAMIDOL (ISOVUE-300) INJECTION 61% COMPARISON:  Chest radiograph 08/14/2013 FINDINGS: CT CHEST FINDINGS Cardiovascular: Heart size is within normal limits without significant pericardial fluid. Normal caliber of the thoracic aorta. Main pulmonary arteries are patent. Motion artifact in the chest. Mediastinum/Nodes: No chest lymphadenopathy. Negative for mediastinal hematoma. Small amount of gas in the esophagus. No axillary lymph node enlargement. No significant lymph node enlargement in the lower chest. Thyroid tissue is poorly characterized. Lungs/Pleura: No large pleural effusions. Significant motion artifact limits evaluation for subtle abnormalities. Focal parenchymal densities in the central aspect of the lingula on sequence 4, image 58 which may represent atelectasis. Otherwise, no significant airspace disease or consolidation in the lungs. Musculoskeletal: Both shoulders are located. No evidence for a displaced rib fracture. Thoracic vertebral body heights are maintained. Dense calcification in the right breast tissue. CT ABDOMEN PELVIS FINDINGS Hepatobiliary: Cholecystectomy. No acute abnormality involving the liver. Portal venous system is patent. Small calcification along the anterior dome. Pancreas: Limited evaluation due to motion artifact but no gross abnormality. Spleen: Normal in size without focal abnormality. Adrenals/Urinary Tract: Normal adrenal glands. Normal appearance of both kidneys without hydronephrosis. No suspicious renal lesions. Urinary bladder is  unremarkable. Stomach/Bowel: Diverticulosis in the sigmoid colon without acute inflammatory changes. Normal appendix. No evidence for bowel obstruction or inflammation. Vascular/Lymphatic: No significant vascular findings are present. No enlarged abdominal or pelvic lymph nodes. Reproductive: Nodular structure along the posterior aspect of the uterus probably represents a fibroid. Otherwise, the uterus and adnexal structures are unremarkable. Other: Negative for free fluid. Negative for free air. Small umbilical hernia containing fat. Musculoskeletal: Degenerative facet disease in lower lumbar spine. The lumbar vertebral body heights are maintained. Mild disc space narrowing at L1-L2. IMPRESSION: 1. No acute traumatic injury in the chest, abdomen or pelvis. 2. Few densities in the lingula probably represent atelectasis but limited evaluation of the lungs due to excessive motion artifact. Negative for a pneumothorax. 3. Probable uterine fibroid. Electronically Signed   By: Markus Daft M.D.   On: 04/04/2018 15:27   Dg Knee Complete 4 Views Left  Result Date: 04/04/2018 CLINICAL DATA:  Recent fall from stairs with knee pain, initial encounter EXAM: LEFT KNEE - COMPLETE 4+ VIEW COMPARISON:  None. FINDINGS: No acute fracture or dislocation is noted. Irregularity along the posterior patella is noted likely related to previous fracture. No joint effusion is seen. No soft tissue abnormality is noted. IMPRESSION: Posterior patellar irregularity likely related to prior trauma. No acute abnormality noted. Electronically Signed   By: Inez Catalina M.D.   On: 04/04/2018 15:43   Dg Knee Complete 4 Views Right  Result Date: 04/04/2018 CLINICAL DATA:  Recent fall with right knee pain, initial encounter EXAM: RIGHT KNEE - COMPLETE 4+ VIEW COMPARISON:  None. FINDINGS: Mild irregularity along the superior posterior patella is noted likely of a chronic nature. Correlation to point tenderness is recommended. No joint effusion is  seen. No other fracture is noted. No soft tissue abnormality is seen. IMPRESSION: Irregularity along the superior patella likely chronic in nature. Electronically Signed   By: Inez Catalina M.D.   On: 04/04/2018 15:44    Procedures Procedures (including critical care time)  Medications Ordered in ED Medications  fentaNYL (SUBLIMAZE) injection 50 mcg (50 mcg Intravenous Given 04/04/18 1340)  iopamidol (ISOVUE-300) 61 % injection 100 mL (100 mLs Intravenous Contrast Given 04/04/18 1441)  fentaNYL (SUBLIMAZE) injection 50 mcg (50 mcg Intravenous Given 04/04/18 1454)     Initial Impression / Assessment and Plan / ED Course  I have reviewed the triage vital signs and the nursing notes.  Pertinent labs & imaging results that were available during my care of the patient were reviewed by me and considered in my medical decision making (see chart for details).  Patient presents to the emergency department for evaluation after fall at church where she hit the left side of her body on a bench, she tried to catch herself with her right arm and also hit her mouth on the edge of a pew.  No loss of consciousness and no other head trauma.  There is no malocclusion of the jaw and no evidence of loose or missing teeth.  Teeth are already in poor dentition with multiple broken and missing teeth but patient reports none of this is new.  She has a small superficial laceration to the upper lip which is not deep enough to require sutured repair there is no active bleeding from this.  Tetanus is up-to-date.  No tenderness on palpation of the jaw or any other any facial tenderness.  Do not feel that head or facial imaging is indicated.  C-spine cleared Via Nexus criteria.  Patient has extreme tenderness to palpation throughout the chest and upper abdomen to even light touch.  There is no obvious palpable deformity but given patient's extreme discomfort will proceed with CT of the chest abdomen and pelvis with contrast to rule  out rib fracture, lung contusion or intra-abdominal injury.  Patient with good lung sounds throughout no clinical evidence for pneumothorax.  Patient has a deformity to the right wrist concerning for fracture will get x-rays.  Also complaining of pain in both knees where she landed on them there is no obvious deformity on exam of both lower extremities are neurovascularly intact but will get x-rays of both knees.  Stable pelvis with no hip tenderness or limited range of motion.  Will get basic abdominal labs.  Fentanyl given for pain.  Labs overall unremarkable, mildly elevated hemoglobin likely with hemoconcentration, mild hyperglycemia of 208, no hematuria on urinalysis, normal liver and renal function and normal lipase.  CT of the chest abdomen pelvis with no evidence of acute traumatic finding.  No evidence of rib fracture, pneumothorax and no intra-abdominal injury.  Patient does have an impacted distal radius fracture, films reviewed by myself and attending, no need for acute reduction, patient also with distal ulnar styloid fracture.  Will place patient in sugar tong splint and will have her follow-up with hand surgery.  Bilateral knee x-rays with no evidence of acute fracture, there is an irregularity with both patellas with some signs of prior trauma but again no evidence of any acute fracture abnormality today.  Patient's pain has been treated and improved here in the department I have discussed reassuring results with patient, she was placed in splint with sling for support.  Have discussed with her that she will likely be sore from soft tissue injury from the fall but there is no evidence of further traumatic injury on evaluation today.  Provided prescription for ibuprofen and pain medication and given follow-up information, there is no specific hand surgeon on call today patient has been seen by [facility] in the past and I discussed with her that she can call and contact them for  follow-up or follow-up with Dr. Apolonio Schneiders who is on-call the following day for hand  surgery.  Discussed appropriate return precautions.  Patient expresses understanding and agreement with plan and is stable for discharge home in good condition at this time.  Final Clinical Impressions(s) / ED Diagnoses   Final diagnoses:  Fall, initial encounter  Chest wall pain  Generalized abdominal pain  Closed fracture of right wrist, initial encounter  Acute pain of both knees    ED Discharge Orders         Ordered    ibuprofen (ADVIL,MOTRIN) 600 MG tablet  Every 6 hours PRN     04/04/18 1708    HYDROcodone-acetaminophen (NORCO) 5-325 MG tablet  Every 6 hours PRN     04/04/18 1708           Jacqlyn Larsen, PA-C 04/07/18 1002    Virgel Manifold, MD 04/09/18 [phone]

## 2018-04-13 ENCOUNTER — Encounter (HOSPITAL_BASED_OUTPATIENT_CLINIC_OR_DEPARTMENT_OTHER): Payer: Self-pay | Admitting: *Deleted

## 2018-04-13 ENCOUNTER — Other Ambulatory Visit: Payer: Self-pay | Admitting: Orthopedic Surgery

## 2018-04-14 ENCOUNTER — Other Ambulatory Visit: Payer: Self-pay

## 2018-04-14 NOTE — Progress Notes (Signed)
Patient for Dr Grandville Silos scheduled for ORIF distal radius fracture with BMI 49.6 and hx DM, HTN. She will come in for Anesthesia consult before surgery. Labs and EKG done in ED on 04-04-18.

## 2018-04-15 NOTE — H&P (Signed)
Tara Johnson is an 58 y.o. female.   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  She was seen in the emergency department where she was x-rayed, placed into a sugar tong splint, and referred here for further evaluation and treatment.  The patient indicates that she is out of 800 mg ibuprofen as well as hydrocodone.  She states that she has been taking Advil.  The patient is a type II diabetic.  She has hypertension and hypothyroidism.  Past Medical History:  Diagnosis Date  . Arthritis    lower back and bilateral knees  . Asperger's syndrome    High functioning  . Asthma    associated with upper respiratory infection use inhaler as needed  . Autism    high functioning  . Cancer ([place])    precancerous lesions on skin 20 plus  . Chronic back pain   . Closed fracture of right distal radius   . Complication of anesthesia    prolonged sedation  . Diabetes mellitus without complication ([place])   . Diabetic neuropathy ([place])   . Hip pain   . Hypertension   . Hypothyroidism   . Osteopenia   . Thyroid disease   . Vitamin D deficiency     Past Surgical History:  Procedure Laterality Date  . CHOLECYSTECTOMY    . DILATATION & CURETTAGE/HYSTEROSCOPY WITH MYOSURE N/A 09/26/2015   Procedure: DILATATION & CURETTAGE/HYSTEROSCOPY WITH MYOSURE;  Surgeon: Christophe Louis, MD;  Location: [facility];  Service: Gynecology;  Laterality: N/A;  Polypectomy    Family History  Problem Relation Age of Onset  . Breast cancer Cousin    Social History:  reports that she has never smoked. She has never used smokeless tobacco. She reports current alcohol use. She reports that she does not use drugs.  Allergies:  Allergies  Allergen Reactions  . Metformin And Related Diarrhea    Nausea    No  medications prior to admission.    No results found for this or any previous visit (from the past 48 hour(s)). No results found.  Review of Systems  All other systems reviewed and are negative.   Height 5\' 4"  (1.626 m), weight 131.1 kg. Physical Exam  Constitutional:  WD, WN, NAD HEENT:  NCAT, EOMI Neuro/Psych:  Alert & oriented to person, place, and time; appropriate mood & affect Lymphatic: No generalized UE edema or lymphadenopathy Extremities / MSK:  Both UE are normal with respect to appearance, ranges of motion, joint stability, muscle strength/tone, sensation, & perfusion except as otherwise noted:  The patient presents in a sugar tong splint that extends all the way into the palm past the MP joints where she is unable to flex her digits completely.  The margins about the splint demonstrate no skin abrasions.  NVI.  Labs / Xrays:  4 views of the right wrist in the splint ordered and obtained today including an inclined lateral demonstrate a closed, extra-articular, distal radius fracture with approximately 30 of dorsal angulation as well as comminution and impaction.  Assessment: Right distal radius fracture  Plan:  The findings are discussed with the patient.  It is recommended that she move forward with open treatment of her right distal radius and she plans to do so on Monday, 04/19/2018.The details of the operative procedure  were discussed with the patient.  Questions were invited and answered.  In addition to the goal of the procedure, the risks of the procedure to include but not limited to bleeding; infection; damage to the nerves or blood vessels that could result in bleeding, numbness, weakness, chronic pain, and the need for additional procedures; stiffness; the need for revision surgery; and anesthetic risks were reviewed.  No specific outcome was guaranteed or implied.  Informed consent was obtained.   Jolyn Nap, MD 04/15/2018, 3:06 PM

## 2018-04-16 NOTE — Progress Notes (Signed)
Anesthesia consult per Dr. Kalman Shan, will proceed with surgery as scheduled. Updated weight and height in Epic.

## 2018-04-19 ENCOUNTER — Ambulatory Visit (HOSPITAL_BASED_OUTPATIENT_CLINIC_OR_DEPARTMENT_OTHER): Payer: 59 | Admitting: Certified Registered"

## 2018-04-19 ENCOUNTER — Ambulatory Visit (HOSPITAL_COMMUNITY): Payer: 59

## 2018-04-19 ENCOUNTER — Other Ambulatory Visit: Payer: Self-pay

## 2018-04-19 ENCOUNTER — Encounter (HOSPITAL_BASED_OUTPATIENT_CLINIC_OR_DEPARTMENT_OTHER): Payer: Self-pay | Admitting: Certified Registered"

## 2018-04-19 ENCOUNTER — Encounter (HOSPITAL_BASED_OUTPATIENT_CLINIC_OR_DEPARTMENT_OTHER)
Admission: RE | Disposition: A | Discharge status: Home / Self Care | Payer: Self-pay | Source: Home / Self Care | Attending: Orthopedic Surgery

## 2018-04-19 ENCOUNTER — Ambulatory Visit (HOSPITAL_BASED_OUTPATIENT_CLINIC_OR_DEPARTMENT_OTHER)
Admission: RE | Admit: 2018-04-19 | Discharge: 2018-04-19 | Disposition: A | Discharge status: Home / Self Care | Payer: 59 | Attending: Orthopedic Surgery | Admitting: Orthopedic Surgery

## 2018-04-19 DIAGNOSIS — S52551A Other extraarticular fracture of lower end of right radius, initial encounter for closed fracture: Secondary | ICD-10-CM | POA: Insufficient documentation

## 2018-04-19 DIAGNOSIS — W19XXXA Unspecified fall, initial encounter: Secondary | ICD-10-CM | POA: Insufficient documentation

## 2018-04-19 DIAGNOSIS — E1140 Type 2 diabetes mellitus with diabetic neuropathy, unspecified: Secondary | ICD-10-CM | POA: Diagnosis not present

## 2018-04-19 DIAGNOSIS — I10 Essential (primary) hypertension: Secondary | ICD-10-CM | POA: Insufficient documentation

## 2018-04-19 DIAGNOSIS — E039 Hypothyroidism, unspecified: Secondary | ICD-10-CM | POA: Diagnosis not present

## 2018-04-19 DIAGNOSIS — Z79890 Hormone replacement therapy (postmenopausal): Secondary | ICD-10-CM | POA: Insufficient documentation

## 2018-04-19 DIAGNOSIS — S52501A Unspecified fracture of the lower end of right radius, initial encounter for closed fracture: Secondary | ICD-10-CM | POA: Diagnosis present

## 2018-04-19 DIAGNOSIS — Z888 Allergy status to other drugs, medicaments and biological substances status: Secondary | ICD-10-CM | POA: Diagnosis not present

## 2018-04-19 DIAGNOSIS — F845 Asperger's syndrome: Secondary | ICD-10-CM | POA: Diagnosis not present

## 2018-04-19 DIAGNOSIS — Z7984 Long term (current) use of oral hypoglycemic drugs: Secondary | ICD-10-CM | POA: Insufficient documentation

## 2018-04-19 DIAGNOSIS — J45909 Unspecified asthma, uncomplicated: Secondary | ICD-10-CM | POA: Insufficient documentation

## 2018-04-19 DIAGNOSIS — Y9222 Religious institution as the place of occurrence of the external cause: Secondary | ICD-10-CM | POA: Insufficient documentation

## 2018-04-19 DIAGNOSIS — Z419 Encounter for procedure for purposes other than remedying health state, unspecified: Secondary | ICD-10-CM

## 2018-04-19 DIAGNOSIS — Z79899 Other long term (current) drug therapy: Secondary | ICD-10-CM | POA: Diagnosis not present

## 2018-04-19 HISTORY — PX: OPEN REDUCTION INTERNAL FIXATION (ORIF) DISTAL RADIAL FRACTURE: SHX5989

## 2018-04-19 HISTORY — DX: Unspecified fracture of the lower end of right radius, initial encounter for closed fracture: S52.501A

## 2018-04-19 LAB — GLUCOSE, CAPILLARY
Glucose-Capillary: 129 mg/dL — ABNORMAL HIGH (ref 70–99)
Glucose-Capillary: 155 mg/dL — ABNORMAL HIGH (ref 70–99)

## 2018-04-19 SURGERY — OPEN REDUCTION INTERNAL FIXATION (ORIF) DISTAL RADIUS FRACTURE
Anesthesia: General | Site: Wrist | Laterality: Right

## 2018-04-19 MED ORDER — MIDAZOLAM HCL 2 MG/2ML IJ SOLN
1.0000 mg | INTRAMUSCULAR | Status: DC | PRN
  Administered 2018-04-19: 2 mg via INTRAVENOUS

## 2018-04-19 MED ORDER — DEXAMETHASONE SODIUM PHOSPHATE 10 MG/ML IJ SOLN
INTRAMUSCULAR | Status: DC | PRN
  Administered 2018-04-19: 4 mg via INTRAVENOUS

## 2018-04-19 MED ORDER — ONDANSETRON HCL 4 MG/2ML IJ SOLN
INTRAMUSCULAR | Status: DC | PRN
  Administered 2018-04-19: 4 mg via INTRAVENOUS

## 2018-04-19 MED ORDER — LACTATED RINGERS IV SOLN
INTRAVENOUS | Status: DC
  Administered 2018-04-19: 07:00:00 via INTRAVENOUS

## 2018-04-19 MED ORDER — LIDOCAINE HCL (CARDIAC) PF 100 MG/5ML IV SOSY
PREFILLED_SYRINGE | INTRAVENOUS | Status: DC | PRN
  Administered 2018-04-19: 60 mg via INTRAVENOUS

## 2018-04-19 MED ORDER — FENTANYL CITRATE (PF) 100 MCG/2ML IJ SOLN
50.0000 ug | INTRAMUSCULAR | Status: DC | PRN
  Administered 2018-04-19: 50 ug via INTRAVENOUS

## 2018-04-19 MED ORDER — ACETAMINOPHEN 325 MG PO TABS: 650.0000 mg | ORAL_TABLET | Freq: Four times a day (QID) | ORAL | Status: DC

## 2018-04-19 MED ORDER — CEFAZOLIN SODIUM-DEXTROSE 2-4 GM/100ML-% IV SOLN
2.0000 g | INTRAVENOUS | Status: AC
  Administered 2018-04-19: 3 g via INTRAVENOUS

## 2018-04-19 MED ORDER — FENTANYL CITRATE (PF) 100 MCG/2ML IJ SOLN: 25.0000 ug | INTRAMUSCULAR | Status: DC | PRN

## 2018-04-19 MED ORDER — SCOPOLAMINE 1 MG/3DAYS TD PT72: 1.0000 | MEDICATED_PATCH | Freq: Once | TRANSDERMAL | Status: DC | PRN

## 2018-04-19 MED ORDER — CLONIDINE HCL (ANALGESIA) 100 MCG/ML EP SOLN
EPIDURAL | Status: DC | PRN
  Administered 2018-04-19: 50 ug

## 2018-04-19 MED ORDER — GABAPENTIN 300 MG PO CAPS: 300.0000 mg | ORAL_CAPSULE | Freq: Once | ORAL | Status: DC

## 2018-04-19 MED ORDER — MIDAZOLAM HCL 2 MG/2ML IJ SOLN
INTRAMUSCULAR | Status: AC
  Filled 2018-04-19: qty 2

## 2018-04-19 MED ORDER — ONDANSETRON HCL 4 MG/2ML IJ SOLN: 4.0000 mg | Freq: Once | INTRAMUSCULAR | Status: DC | PRN

## 2018-04-19 MED ORDER — PROPOFOL 10 MG/ML IV BOLUS
INTRAVENOUS | Status: DC | PRN
  Administered 2018-04-19: 200 mg via INTRAVENOUS

## 2018-04-19 MED ORDER — ROPIVACAINE HCL 7.5 MG/ML IJ SOLN
INTRAMUSCULAR | Status: DC | PRN
  Administered 2018-04-19: 20 mL via PERINEURAL

## 2018-04-19 MED ORDER — ACETAMINOPHEN 500 MG PO TABS: 1000.0000 mg | ORAL_TABLET | Freq: Once | ORAL | Status: DC

## 2018-04-19 MED ORDER — CEFAZOLIN SODIUM-DEXTROSE 2-4 GM/100ML-% IV SOLN
INTRAVENOUS | Status: AC
  Filled 2018-04-19: qty 100

## 2018-04-19 MED ORDER — FENTANYL CITRATE (PF) 100 MCG/2ML IJ SOLN
INTRAMUSCULAR | Status: AC
  Filled 2018-04-19: qty 2

## 2018-04-19 MED ORDER — OXYCODONE HCL 5 MG PO TABS: 5.0000 mg | ORAL_TABLET | Freq: Four times a day (QID) | ORAL | 0 refills | Status: DC | PRN

## 2018-04-19 SURGICAL SUPPLY — 71 items
AO Driver Connection, Square tip 2.0mm ×2 IMPLANT
BANDAGE COBAN STERILE 2 (GAUZE/BANDAGES/DRESSINGS) IMPLANT
BANDAGE ELASTIC 4 VELCRO ST LF (GAUZE/BANDAGES/DRESSINGS) ×2 IMPLANT
BIT DRILL SOLID 2.0X40MM (BIT) IMPLANT
BIT DRILL SOLID 2.5X40MM (BIT) IMPLANT
BLADE MINI RND TIP GREEN BEAV (BLADE) IMPLANT
BLADE SURG 15 STRL LF DISP TIS (BLADE) ×1 IMPLANT
BLADE SURG 15 STRL SS (BLADE) ×6
BNDG CMPR 9X4 STRL LF SNTH (GAUZE/BANDAGES/DRESSINGS) ×1
BNDG COHESIVE 4X5 TAN STRL (GAUZE/BANDAGES/DRESSINGS) ×3 IMPLANT
BNDG ESMARK 4X9 LF (GAUZE/BANDAGES/DRESSINGS) ×3 IMPLANT
BNDG GAUZE ELAST 4 BULKY (GAUZE/BANDAGES/DRESSINGS) ×3 IMPLANT
BRUSH SCRUB EZ PLAIN DRY (MISCELLANEOUS) ×2 IMPLANT
CANISTER SUCT 1200ML W/VALVE (MISCELLANEOUS) ×3 IMPLANT
CHLORAPREP W/TINT 26ML (MISCELLANEOUS) ×3 IMPLANT
CORD BIPOLAR FORCEPS 12FT (ELECTRODE) ×3 IMPLANT
COVER BACK TABLE 60X90IN (DRAPES) ×3 IMPLANT
COVER MAYO STAND STRL (DRAPES) ×5 IMPLANT
COVER WAND RF STERILE (DRAPES) IMPLANT
CUFF TOURNIQUET SINGLE 18IN (TOURNIQUET CUFF) IMPLANT
CUFF TOURNIQUET SINGLE 24IN (TOURNIQUET CUFF) ×2 IMPLANT
DRAPE C-ARM 42X72 X-RAY (DRAPES) ×3 IMPLANT
DRAPE EXTREMITY T 121X128X90 (DISPOSABLE) ×3 IMPLANT
DRAPE SURG 17X23 STRL (DRAPES) ×3 IMPLANT
DRILL SOLID 2.0X40MM (BIT)
DRILL SOLID 2.5X40MM (BIT)
DRSG ADAPTIC 3X8 NADH LF (GAUZE/BANDAGES/DRESSINGS) ×3 IMPLANT
DRSG EMULSION OIL 3X3 NADH (GAUZE/BANDAGES/DRESSINGS) IMPLANT
ELECT REM PT RETURN 9FT ADLT (ELECTROSURGICAL) ×3
ELECTRODE REM PT RTRN 9FT ADLT (ELECTROSURGICAL) ×1 IMPLANT
GAUZE SPONGE 4X4 12PLY STRL LF (GAUZE/BANDAGES/DRESSINGS) ×3 IMPLANT
GLOVE BIO SURGEON STRL SZ 6.5 (GLOVE) ×1 IMPLANT
GLOVE BIO SURGEON STRL SZ7.5 (GLOVE) ×3 IMPLANT
GLOVE BIO SURGEONS STRL SZ 6.5 (GLOVE) ×1
GLOVE BIOGEL PI IND STRL 7.0 (GLOVE) ×1 IMPLANT
GLOVE BIOGEL PI IND STRL 8 (GLOVE) ×1 IMPLANT
GLOVE BIOGEL PI INDICATOR 7.0 (GLOVE) ×6
GLOVE BIOGEL PI INDICATOR 8 (GLOVE) ×2
GLOVE ECLIPSE 6.5 STRL STRAW (GLOVE) ×5 IMPLANT
GOWN STRL REUS W/ TWL LRG LVL3 (GOWN DISPOSABLE) ×2 IMPLANT
GOWN STRL REUS W/ TWL XL LVL3 (GOWN DISPOSABLE) IMPLANT
GOWN STRL REUS W/TWL LRG LVL3 (GOWN DISPOSABLE) ×3
GOWN STRL REUS W/TWL XL LVL3 (GOWN DISPOSABLE) ×6 IMPLANT
NDL HYPO 25X1 1.5 SAFETY (NEEDLE) IMPLANT
NEEDLE HYPO 25X1 1.5 SAFETY (NEEDLE) IMPLANT
NS IRRIG 1000ML POUR BTL (IV SOLUTION) ×3 IMPLANT
PACK BASIN DAY SURGERY FS (CUSTOM PROCEDURE TRAY) ×3 IMPLANT
PADDING CAST ABS 4INX4YD NS (CAST SUPPLIES)
PADDING CAST ABS COTTON 4X4 ST (CAST SUPPLIES) IMPLANT
PENCIL BUTTON HOLSTER BLD 10FT (ELECTRODE) ×3 IMPLANT
RUBBERBAND STERILE (MISCELLANEOUS) IMPLANT
SKELETAL DYNAMICS DVR SET (Set) ×2 IMPLANT
SLEEVE SCD COMPRESS KNEE MED (MISCELLANEOUS) ×3 IMPLANT
SLING ARM FOAM STRAP LRG (SOFTGOODS) IMPLANT
SLING ARM FOAM STRAP XLG (SOFTGOODS) ×2 IMPLANT
SPLINT PLASTER CAST XFAST 3X15 (CAST SUPPLIES) IMPLANT
SPLINT PLASTER XTRA FASTSET 3X (CAST SUPPLIES)
STOCKINETTE 6  STRL (DRAPES) ×2
STOCKINETTE 6 STRL (DRAPES) ×1 IMPLANT
SUCTION FRAZIER HANDLE 10FR (MISCELLANEOUS) ×2
SUCTION TUBE FRAZIER 10FR DISP (MISCELLANEOUS) ×1 IMPLANT
SUT VIC AB 2-0 PS2 27 (SUTURE) ×3 IMPLANT
SUT VICRYL 4-0 PS2 18IN ABS (SUTURE) IMPLANT
SUT VICRYL RAPIDE 4-0 (SUTURE) IMPLANT
SUT VICRYL RAPIDE 4/0 PS 2 (SUTURE) ×3 IMPLANT
SYR 10ML LL (SYRINGE) IMPLANT
SYR BULB 3OZ (MISCELLANEOUS) ×3 IMPLANT
TOWEL GREEN STERILE FF (TOWEL DISPOSABLE) ×5 IMPLANT
TUBE CONNECTING 20'X1/4 (TUBING) ×1
TUBE CONNECTING 20X1/4 (TUBING) ×2 IMPLANT
UNDERPAD 30X30 (UNDERPADS AND DIAPERS) ×3 IMPLANT

## 2018-04-19 NOTE — Interval H&P Note (Signed)
History and Physical Interval Note:  04/19/2018 7:32 AM  Tara Johnson  has presented today for surgery, with the diagnosis of right distal radius fracture  The various methods of treatment have been discussed with the patient and family. After consideration of risks, benefits and other options for treatment, the patient has consented to  Procedure(s): OPEN TREATMENT OF RIGHT DISTAL RADIUS (Right) as a surgical intervention .  The patient's history has been reviewed, patient examined, no change in status, stable for surgery.  I have reviewed the patient's chart and labs.  Questions were answered to the patient's satisfaction.     Jolyn Nap

## 2018-04-19 NOTE — Anesthesia Preprocedure Evaluation (Addendum)
Anesthesia Evaluation  Patient identified by MRN, date of birth, ID band Patient awake    Reviewed: Allergy & Precautions, NPO status , Patient's Chart, lab work & pertinent test results  History of Anesthesia Complications (+) history of anesthetic complications ("prolonged sedation")  Airway Mallampati: II  TM Distance: >3 FB Neck ROM: Full    Dental  (+) Dental Advisory Given, Poor Dentition, Chipped, Missing   Pulmonary asthma ,    Pulmonary exam normal breath sounds clear to auscultation       Cardiovascular hypertension, Pt. on medications Normal cardiovascular exam Rhythm:Regular Rate:Normal     Neuro/Psych negative neurological ROS     GI/Hepatic negative GI ROS, Neg liver ROS,   Endo/Other  diabetes, Type 2, Oral Hypoglycemic AgentsHypothyroidism   Renal/GU negative Renal ROS     Musculoskeletal  (+) Arthritis ,   Abdominal   Peds  (+) mental retardation Hematology negative hematology ROS (+)   Anesthesia Other Findings Day of surgery medications reviewed with the patient.  Reproductive/Obstetrics                           Anesthesia Physical Anesthesia Plan  ASA: III  Anesthesia Plan: General   Post-op Pain Management:  Regional for Post-op pain   Induction: Intravenous  PONV Risk Score and Plan: 3  Airway Management Planned: LMA  Additional Equipment:   Intra-op Plan:   Post-operative Plan: Extubation in OR  Informed Consent: I have reviewed the patients History and Physical, chart, labs and discussed the procedure including the risks, benefits and alternatives for the proposed anesthesia with the patient or authorized representative who has indicated his/her understanding and acceptance.     Dental advisory given  Plan Discussed with: CRNA  Anesthesia Plan Comments:         Anesthesia Quick Evaluation

## 2018-04-19 NOTE — Discharge Instructions (Signed)
Post Anesthesia Home Care Instructions  Activity: Get plenty of rest for the remainder of the day. A responsible individual must stay with you for 24 hours following the procedure.  For the next 24 hours, DO NOT: -Drive a car -Paediatric nurse -Drink alcoholic beverages -Take any medication unless instructed by your physician -Make any legal decisions or sign important papers.  Meals: Start with liquid foods such as gelatin or soup. Progress to regular foods as tolerated. Avoid greasy, spicy, heavy foods. If nausea and/or vomiting occur, drink only clear liquids until the nausea and/or vomiting subsides. Call your physician if vomiting continues.  Special Instructions/Symptoms: Your throat may feel dry or sore from the anesthesia or the breathing tube placed in your throat during surgery. If this causes discomfort, gargle with warm salt water. The discomfort should disappear within 24 hours.  If you had a scopolamine patch placed behind your ear for the management of post- operative nausea and/or vomiting:  1. The medication in the patch is effective for 72 hours, after which it should be removed.  Wrap patch in a tissue and discard in the trash. Wash hands thoroughly with soap and water. 2. You may remove the patch earlier than 72 hours if you experience unpleasant side effects which may include dry mouth, dizziness or visual disturbances. 3. Avoid touching the patch. Wash your hands with soap and water after contact with the patch.    Discharge Instructions   You have a dressing with a plaster splint incorporated in it. Move your fingers as much as possible, making a full fist and fully opening the fist. Elevate your hand to reduce pain & swelling of the digits.  Ice over the operative site may be helpful to reduce pain & swelling.  DO NOT USE HEAT. Pain medicine has been prescribed for you.  Take Tylenol 650 mg and Ibuprofen 600 mg every 6 hours together. Take Oxycodone  additionally for severe post operative pain. Leave the dressing in place until you return to our office.  You may shower, but keep the bandage clean & dry.  You may drive a car when you are off of prescription pain medications and can safely control your vehicle with both hands. Our office will call you to arrange follow-up   Please call [phone] during normal business hours or [phone] after hours for any problems. Including the following:  - excessive redness of the incisions - drainage for more than 4 days - fever of more than 101.5 F  *Please note that pain medications will not be refilled after hours or on weekends.  WORK STATUS: You may return to work on 04/26/2018 with no lifting gripping or grasping greater than pencil and paper tasks with the right arm.     Regional Anesthesia Blocks  1. Numbness or the inability to move the "blocked" extremity may last from 3-48 hours after placement. The length of time depends on the medication injected and your individual response to the medication. If the numbness is not going away after 48 hours, call your surgeon.  2. The extremity that is blocked will need to be protected until the numbness is gone and the  Strength has returned. Because you cannot feel it, you will need to take extra care to avoid injury. Because it may be weak, you may have difficulty moving it or using it. You may not know what position it is in without looking at it while the block is in effect.  3. For blocks in the  legs and feet, returning to weight bearing and walking needs to be done carefully. You will need to wait until the numbness is entirely gone and the strength has returned. You should be able to move your leg and foot normally before you try and bear weight or walk. You will need someone to be with you when you first try to ensure you do not fall and possibly risk injury.  4. Bruising and tenderness at the needle site are common side effects and will  resolve in a few days.  5. Persistent numbness or new problems with movement should be communicated to the surgeon or the [facility] [phone] [place] [phone]).

## 2018-04-19 NOTE — Anesthesia Postprocedure Evaluation (Signed)
Anesthesia Post Note  Patient: Tara Johnson  Procedure(s) Performed: OPEN TREATMENT OF RIGHT DISTAL RADIUS (Right Wrist)     Patient location during evaluation: PACU Anesthesia Type: General Level of consciousness: awake and alert Pain management: pain level controlled Vital Signs Assessment: post-procedure vital signs reviewed and stable Respiratory status: spontaneous breathing, nonlabored ventilation and respiratory function stable Cardiovascular status: blood pressure returned to baseline and stable Postop Assessment: no apparent nausea or vomiting Anesthetic complications: no    Last Vitals:  Vitals:   04/19/18 0900 04/19/18 0945  BP: 137/75 140/77  Pulse: 92 89  Resp: 19 18  Temp:  36.6 C  SpO2: 94% 96%    Last Pain:  Vitals:   04/19/18 0945  TempSrc:   PainSc: 0-No pain                 Catalina Gravel

## 2018-04-19 NOTE — Transfer of Care (Signed)
Immediate Anesthesia Transfer of Care Note  Patient: Tara Johnson  Procedure(s) Performed: OPEN TREATMENT OF RIGHT DISTAL RADIUS (Right Wrist)  Patient Location: PACU  Anesthesia Type:GA combined with regional for post-op pain  Level of Consciousness: awake, alert , oriented and patient cooperative  Airway & Oxygen Therapy: Patient Spontanous Breathing and Patient connected to face mask oxygen  Post-op Assessment: Report given to RN and Post -op Vital signs reviewed and stable  Post vital signs: Reviewed and stable  Last Vitals:  Vitals Value Taken Time  BP    Temp    Pulse    Resp    SpO2      Last Pain:  Vitals:   04/19/18 0715  TempSrc:   PainSc: 3       Patients Stated Pain Goal: 3 (15/04/13 6438)  Complications: No apparent anesthesia complications

## 2018-04-19 NOTE — Progress Notes (Signed)
Assisted Dr. Turk with right, ultrasound guided, supraclavicular block. Side rails up, monitors on throughout procedure. See vital signs in flow sheet. Tolerated Procedure well. 

## 2018-04-19 NOTE — Anesthesia Procedure Notes (Signed)
Anesthesia Regional Block: Supraclavicular block   Pre-Anesthetic Checklist: ,, timeout performed, Correct Patient, Correct Site, Correct Laterality, Correct Procedure, Correct Position, site marked, Risks and benefits discussed,  Surgical consent,  Pre-op evaluation,  At surgeon's request and post-op pain management  Laterality: Right  Prep: chloraprep       Needles:  Injection technique: Single-shot  Needle Type: Echogenic Needle     Needle Length: 9cm  Needle Gauge: 21     Additional Needles:   Procedures:,,,, ultrasound used (permanent image in chart),,,,  Narrative:  Start time: 04/19/2018 7:09 AM End time: 04/19/2018 7:14 AM Injection made incrementally with aspirations every 5 mL.  Performed by: Personally  Anesthesiologist: Catalina Gravel, MD  Additional Notes: No pain on injection. No increased resistance to injection. Injection made in 5cc increments.  Good needle visualization.  Patient tolerated procedure well.

## 2018-04-19 NOTE — Anesthesia Procedure Notes (Signed)
Procedure Name: LMA Insertion Date/Time: 04/19/2018 7:38 AM Performed by: Samari Gorby, Ernesta Amble, CRNA Pre-anesthesia Checklist: Patient identified, Emergency Drugs available, Suction available and Patient being monitored Patient Re-evaluated:Patient Re-evaluated prior to induction Oxygen Delivery Method: Circle system utilized Preoxygenation: Pre-oxygenation with 100% oxygen Induction Type: IV induction Ventilation: Mask ventilation without difficulty LMA: LMA inserted LMA Size: 4.0 Number of attempts: 1 Airway Equipment and Method: Bite block Placement Confirmation: positive ETCO2 Tube secured with: Tape Dental Injury: Teeth and Oropharynx as per pre-operative assessment

## 2018-04-19 NOTE — Op Note (Signed)
04/19/2018  7:32 AM  PATIENT:  Tara Johnson  58 y.o. female  PRE-OPERATIVE DIAGNOSIS:  Displaced right distal radius fracture  POST-OPERATIVE DIAGNOSIS:  Same  PROCEDURE:  ORIF R DRFx (extra-artic) [phone]  SURGEON: Rayvon Char. Grandville Silos, MD  PHYSICIAN ASSISTANT: Morley Kos, OPA-C  ANESTHESIA:  regional and general  SPECIMENS:  None  DRAINS: None  EBL:  less than 50 mL  PREOPERATIVE INDICATIONS:  Tara Johnson is a  58 y.o. female with a displaced, dorsally-tilted R DRFx  The risks benefits and alternatives were discussed with the patient preoperatively including but not limited to the risks of infection, bleeding, nerve injury, cardiopulmonary complications, the need for revision surgery, among others, and the patient verbalized understanding and consented to proceed.  OPERATIVE IMPLANTS: [employer] Geminus plate/screws/pegs  OPERATIVE PROCEDURE: After receiving prophylactic antibiotics & a regional block, the patient was escorted to the operative theatre and placed in a supine position. General anesthesia was administered.  A surgical "time-out" was performed during which the planned procedure, proposed operative site, and the correct patient identity were compared to the operative consent and agreement confirmed by the circulating nurse according to current facility policy. Following application of a tourniquet to the operative extremity, the exposed skin was pre-scrubbed with Hibiclens scrub brush and then was prepped with Chloraprep and draped in the usual sterile fashion. The limb was exsanguinated with an Esmarch bandage and the tourniquet inflated to approximately 118mmHg higher than systolic BP.   A sinusoidal-shaped incision was marked and made over the FCR axis and the distal forearm. The skin was incised sharply with scalpel, subcutaneous tissues with blunt and spreading dissection. The FCR axis was exploited deeply. The pronator quadratus was reflected in an  L-shaped ulnarly and the brachioradialis was split in a Z-plasty fashion for later reapproximation. The fracture was inspected and provisionally reduced.  This was confirmed fluoroscopically. The appropriately sized plate was selected and found to fit well. It was placed in its provisional alignment of the radius and this was confirmed fluoroscopically.  It was secured to the radius with a screw through the slotted hole.  Additional adjustments were made as necessary, and the distal holes were all drilled and filled.  Peg/screw length distally was selected on the shorter side of measurements to minimize the risk for dorsal cortical penetration. The remainder of the proximal holes were drilled and filled.   Final images were obtained and the DRUJ was examined for stability. It was found to be sufficiently stable. The wound was then copiously irrigated and the brachioradialis repaired with 2-0 Vicryl Rapide suture followed by repair of the pronator quadratus with the same suture type. Tourniquet was released and additional hemostasis obtained and the skin was closed with 2-0 Vicryl deep dermal buried sutures followed by running 4-0 Vicryl Rapide horizontal mattress suture in the skin. A bulky dressing with a volar plaster component was applied and the patient was taken to the recovery room in stable condition.  DISPOSITION: The patient will be discharged home today with typical post-op instructions, returning in 10-15 days for reevaluation with new x-rays of the affected wrist out of the splint to include an inclined lateral and then transition to therapy to have a custom splint constructed and begin rehabilitation.

## 2018-04-20 ENCOUNTER — Encounter (HOSPITAL_BASED_OUTPATIENT_CLINIC_OR_DEPARTMENT_OTHER): Payer: Self-pay | Admitting: Orthopedic Surgery

## 2019-04-30 ENCOUNTER — Other Ambulatory Visit: Payer: Self-pay

## 2019-04-30 ENCOUNTER — Encounter (HOSPITAL_BASED_OUTPATIENT_CLINIC_OR_DEPARTMENT_OTHER): Payer: Self-pay | Admitting: *Deleted

## 2019-04-30 ENCOUNTER — Emergency Department (HOSPITAL_BASED_OUTPATIENT_CLINIC_OR_DEPARTMENT_OTHER)
Admission: EM | Admit: 2019-04-30 | Discharge: 2019-04-30 | Disposition: A | Discharge status: Home / Self Care | Payer: 59 | Attending: Emergency Medicine | Admitting: Emergency Medicine

## 2019-04-30 ENCOUNTER — Emergency Department (HOSPITAL_BASED_OUTPATIENT_CLINIC_OR_DEPARTMENT_OTHER): Payer: 59

## 2019-04-30 DIAGNOSIS — W25XXXA Contact with sharp glass, initial encounter: Secondary | ICD-10-CM | POA: Diagnosis not present

## 2019-04-30 DIAGNOSIS — Z7984 Long term (current) use of oral hypoglycemic drugs: Secondary | ICD-10-CM | POA: Diagnosis not present

## 2019-04-30 DIAGNOSIS — I10 Essential (primary) hypertension: Secondary | ICD-10-CM | POA: Insufficient documentation

## 2019-04-30 DIAGNOSIS — S61011A Laceration without foreign body of right thumb without damage to nail, initial encounter: Secondary | ICD-10-CM | POA: Diagnosis not present

## 2019-04-30 DIAGNOSIS — Y998 Other external cause status: Secondary | ICD-10-CM | POA: Diagnosis not present

## 2019-04-30 DIAGNOSIS — Y92010 Kitchen of single-family (private) house as the place of occurrence of the external cause: Secondary | ICD-10-CM | POA: Insufficient documentation

## 2019-04-30 DIAGNOSIS — Z79899 Other long term (current) drug therapy: Secondary | ICD-10-CM | POA: Diagnosis not present

## 2019-04-30 DIAGNOSIS — J45909 Unspecified asthma, uncomplicated: Secondary | ICD-10-CM | POA: Diagnosis not present

## 2019-04-30 DIAGNOSIS — Z85828 Personal history of other malignant neoplasm of skin: Secondary | ICD-10-CM | POA: Insufficient documentation

## 2019-04-30 DIAGNOSIS — Z23 Encounter for immunization: Secondary | ICD-10-CM | POA: Insufficient documentation

## 2019-04-30 DIAGNOSIS — E039 Hypothyroidism, unspecified: Secondary | ICD-10-CM | POA: Insufficient documentation

## 2019-04-30 DIAGNOSIS — E1140 Type 2 diabetes mellitus with diabetic neuropathy, unspecified: Secondary | ICD-10-CM | POA: Diagnosis not present

## 2019-04-30 DIAGNOSIS — Y93G1 Activity, food preparation and clean up: Secondary | ICD-10-CM | POA: Insufficient documentation

## 2019-04-30 DIAGNOSIS — S6991XA Unspecified injury of right wrist, hand and finger(s), initial encounter: Secondary | ICD-10-CM | POA: Diagnosis present

## 2019-04-30 MED ORDER — TETANUS-DIPHTH-ACELL PERTUSSIS 5-2.5-18.5 LF-MCG/0.5 IM SUSP
0.5000 mL | Freq: Once | INTRAMUSCULAR | Status: AC
  Administered 2019-04-30: 12:00:00 0.5 mL via INTRAMUSCULAR
  Filled 2019-04-30: qty 0.5

## 2019-04-30 MED ORDER — LIDOCAINE HCL (PF) 1 % IJ SOLN
5.0000 mL | Freq: Once | INTRAMUSCULAR | Status: AC
  Administered 2019-04-30: 12:00:00 5 mL
  Filled 2019-04-30: qty 5

## 2019-04-30 NOTE — ED Notes (Signed)
Pt verbalized understanding of dc instructions.

## 2019-04-30 NOTE — ED Provider Notes (Signed)
[name] Provider Note   CSN: [id number] Arrival date & time: 04/30/19  1148     History Chief Complaint  Patient presents with  . Laceration    Tara Johnson is a 59 y.o. female with a hx of diabetes mellitus, hypertension, & hypothyroidism who presents to the ED for evaluation of right thumb laceration which occurred @ 11AM today. Patient states that she reached into a bin with glassware in it to do the dishes and did not realize there was a broken glass in the bin. She accidentally cut the back of her thumb on the glass. States the area is painful, throbbing, worse with movement. Initially had some bleeding but now well controlled. She relays she broke her right wrist last year and has some residual paresthesias from this but no acute change today. Denies weakness. Last tetanus was in 2010. Patient is right hand dominant.   HPI     Past Medical History:  Diagnosis Date  . Arthritis    lower back and bilateral knees  . Asperger's syndrome    High functioning  . Asthma    associated with upper respiratory infection use inhaler as needed  . Autism    high functioning  . Cancer ([place])    precancerous lesions on skin 20 plus  . Chronic back pain   . Closed fracture of right distal radius   . Complication of anesthesia    prolonged sedation  . Diabetes mellitus without complication ([place])   . Diabetic neuropathy ([place])   . Hip pain   . Hypertension   . Hypothyroidism   . Osteopenia   . Thyroid disease   . Vitamin D deficiency     Patient Active Problem List   Diagnosis Date Noted  . Postmenopausal bleeding 09/26/2015  . Endometrial polyp 09/26/2015    Past Surgical History:  Procedure Laterality Date  . CHOLECYSTECTOMY    . DILATATION & CURETTAGE/HYSTEROSCOPY WITH MYOSURE N/A 09/26/2015   Procedure: DILATATION & CURETTAGE/HYSTEROSCOPY WITH MYOSURE;  Surgeon: Christophe Louis, MD;  Location: [facility];  Service: Gynecology;  Laterality: N/A;   Polypectomy  . OPEN REDUCTION INTERNAL FIXATION (ORIF) DISTAL RADIAL FRACTURE Right 04/19/2018   Procedure: OPEN TREATMENT OF RIGHT DISTAL RADIUS;  Surgeon: Milly Jakob, MD;  Location: [facility];  Service: Orthopedics;  Laterality: Right;     OB History   No obstetric history on file.     Family History  Problem Relation Age of Onset  . Breast cancer Cousin     Social History   Tobacco Use  . Smoking status: Never Smoker  . Smokeless tobacco: Never Used  Substance Use Topics  . Alcohol use: Yes    Comment: moderately  . Drug use: No    Home Medications Prior to Admission medications   Medication Sig Start Date End Date Taking? Authorizing Provider  acetaminophen (TYLENOL) 325 MG tablet Take 2 tablets (650 mg total) by mouth every 6 (six) hours. 04/19/18   Milly Jakob, MD  albuterol (PROVENTIL HFA;VENTOLIN HFA) 108 (90 Base) MCG/ACT inhaler Inhale into the lungs every 6 (six) hours as needed for wheezing or shortness of breath.    [provider]  glimepiride (AMARYL) 2 MG tablet Take 1 mg by mouth daily with breakfast.     [provider]  glucose blood test strip 1 each by Other route as needed for other. Use as instructed    [provider]  ibuprofen (ADVIL,MOTRIN) 600 MG tablet  Take 1 tablet (600 mg total) by mouth every 6 (six) hours as needed. 04/04/18   Jacqlyn Larsen, PA-C  levothyroxine (SYNTHROID, LEVOTHROID) 137 MCG tablet Take 137 mcg by mouth daily before breakfast.    [provider]  lisinopril (PRINIVIL,ZESTRIL) 20 MG tablet Take 20 mg by mouth daily.    [provider]  methocarbamol (ROBAXIN) 500 MG tablet Take 500 mg by mouth every 6 (six) hours as needed for muscle spasms.    [provider]  mometasone (ASMANEX, 120 METERED DOSES,) 220 MCG/INH inhaler Inhale 2 puffs into the lungs daily.    [provider]  oxyCODONE (ROXICODONE) 5 MG immediate release tablet Take 1 tablet (5  mg total) by mouth every 6 (six) hours as needed for breakthrough pain. 04/19/18   Milly Jakob, MD    Allergies    Metformin and related  Review of Systems   Review of Systems  Constitutional: Negative for chills and fever.  Respiratory: Negative for shortness of breath.   Cardiovascular: Negative for chest pain.  Gastrointestinal: Negative for vomiting.  Musculoskeletal: Positive for arthralgias.  Skin: Positive for wound.  Neurological: Negative for weakness.       Baseline paresthesias s/p R wrist surgery, no acute change.     Physical Exam Updated Vital Signs BP (!) 144/105 (BP Location: Left Arm)   Temp 98.5 F (36.9 C) (Oral)   Resp 20   Ht 5\' 4"  (1.626 m)   Wt 127.9 kg   SpO2 97%   BMI 48.41 kg/m  HR 90 bpm  Physical Exam Vitals and nursing note reviewed.  Constitutional:      General: She is not in acute distress.    Appearance: Normal appearance. She is not ill-appearing or toxic-appearing.  HENT:     Head: Normocephalic and atraumatic.  Cardiovascular:     Rate and Rhythm: Normal rate.     Pulses:          Radial pulses are 2+ on the right side and 2+ on the left side.  Pulmonary:     Effort: No respiratory distress.     Breath sounds: Normal breath sounds.  Musculoskeletal:     Cervical back: Normal range of motion and neck supple.     Comments: Upper extremities: R dorsal thumb just distal to the MCP joint there is a 2 cm linear laceration that is approximately 2 mm deep. No active bleeding. No appreciable FB. No visible tendon involvement. Full intact AROM throughout. Able to flex/extend against resistance @ the right 1st MCP/IP joints. Tender to palpation over the PIP joint, proximal phalanx, & the MCP of the right 1st digit. No anatomical snuffbox tenderness. Otherwise nontender.   Skin:    General: Skin is warm and dry.     Capillary Refill: Capillary refill takes less than 2 seconds.  Neurological:     Mental Status: She is alert.     Comments:  Alert. Clear speech. Sensation grossly intact to bilateral upper extremities. 5/5 symmetric grip strength. Able to perform OK sign, thumbs up and cross 2nd/3rd digits bilaterally. Ambulatory.   Psychiatric:        Mood and Affect: Mood normal.        Behavior: Behavior normal.     ED Results / Procedures / Treatments   Labs (all labs ordered are listed, but only abnormal results are displayed) Labs Reviewed - No data to display  EKG None  Radiology DG Finger Thumb Right  Result Date: 04/30/2019  CLINICAL DATA:  Injury, cut thumb on glass, laceration. EXAM: RIGHT THUMB 2+V COMPARISON:  None. FINDINGS: Three views of the RIGHT thumb are provided. No osseous fracture or dislocation is seen. No radiodense foreign body is appreciated within the surrounding soft tissues. IMPRESSION: No osseous fracture or dislocation. No radiodense foreign body seen within the surrounding soft tissues. Electronically Signed   By: Franki Cabot M.D.   On: 04/30/2019 12:32    Procedures .Marland KitchenLaceration Repair  Date/Time: 04/30/2019 1:06 PM Performed by: Amaryllis Dyke, PA-C Authorized by: Amaryllis Dyke, PA-C   Consent:    Consent obtained:  Verbal   Consent given by:  Patient   Risks discussed:  Infection, need for additional repair, nerve damage, poor wound healing, poor cosmetic result, pain, tendon damage, vascular damage and retained foreign body   Alternatives discussed:  No treatment Anesthesia (see MAR for exact dosages):    Anesthesia method:  Local infiltration   Local anesthetic:  Lidocaine 1% w/o epi Laceration details:    Location:  Finger   Finger location:  R thumb   Length (cm):  2   Depth (mm):  2 Repair type:    Repair type:  Simple Pre-procedure details:    Preparation:  Patient was prepped and draped in usual sterile fashion and imaging obtained to evaluate for foreign bodies Exploration:    Hemostasis achieved with:  Direct pressure   Wound exploration: wound  explored through full range of motion and entire depth of wound probed and visualized     Wound extent: no tendon damage noted     Contaminated: no   Treatment:    Area cleansed with:  Betadine   Amount of cleaning:  Standard   Irrigation solution:  Sterile water   Irrigation method:  Pressure wash Skin repair:    Repair method:  Sutures   Suture size:  4-0   Suture material:  Nylon   Suture technique:  Simple interrupted   Number of sutures:  4 Approximation:    Approximation:  Close Post-procedure details:    Dressing:  Antibiotic ointment and non-adherent dressing   Patient tolerance of procedure:  Tolerated well, no immediate complications   (including critical care time)  Medications Ordered in ED Medications  lidocaine (PF) (XYLOCAINE) 1 % injection 5 mL (5 mLs Infiltration Given 04/30/19 1215)  Tdap (BOOSTRIX) injection 0.5 mL (0.5 mLs Intramuscular Given 04/30/19 1216)    ED Course  I have reviewed the triage vital signs and the nursing notes.  Pertinent labs & imaging results that were available during my care of the patient were reviewed by me and considered in my medical decision making (see chart for details).    MDM Rules/Calculators/A&P                      Patient presents to the emergency department with laceration to right dorsal thumb which occurred shortly PTA. Patient nontoxic appearing, resting comfortably. X-ray obtained in area of laceration, no fractures/dislocations or apparent radiopaque foreign bodies. Pressure irrigation performed. Wound explored and base of wound visualized in a bloodless field without evidence of foreign body. Laceration repair per procedure note above, tolerated well. Tetanus updated at today's visit. Discussed suture home care as well as need for wound recheck and suture removal in 7 days.  I discussed results, treatment plan, need for follow-up, and return precautions with the patient including signs of infection. Provided  opportunity for questions, patient confirmed understanding and is in agreement  with plan.    Final Clinical Impression(s) / ED Diagnoses Final diagnoses:  Laceration of right thumb without foreign body without damage to nail, initial encounter    Rx / DC Orders ED Discharge Orders    None       Amaryllis Dyke, PA-C 04/30/19 1326    Virgel Manifold, MD 04/30/19 1423

## 2019-04-30 NOTE — ED Triage Notes (Signed)
Presents with laceration to rt thumb, no active bleeding at this time, bandage did have some bright red blood upon arrival. Pt states cut thumb while washing a glass at home.

## 2019-04-30 NOTE — Discharge Instructions (Addendum)
You were seen in the emergency department today for a laceration. Your laceration was closed with 4 stitches. Please keep this area clean and dry for the next 24 hours, after 24 hours you may get this area wet, but avoid soaking the area. Keep the area covered as best possible especially when in the sun to help in minimizing scarring.   Your tetanus has been updated   You will need to have the stitches removed and the wound rechecked in 7 days. Please return to the emergency department, go to an urgent care, or see your primary care provider to have this performed. Return to the ER soon should you start to experience pus type drainage from the wound, redness around the wound, or fevers as this could indicate the area is infected, please return to the ER for any other worsening symptoms or concerns that you may have.

## 2019-05-17 IMAGING — CR DG LUMBAR SPINE COMPLETE 4+V
5 series · 5 of 5 positions shown · non-contrast
Comparison: Lumbar MRI December 03, 2011

CLINICAL DATA: Chronic lumbago

EXAM:
LUMBAR SPINE - COMPLETE 4+ VIEW

[w lumbar spine ap]
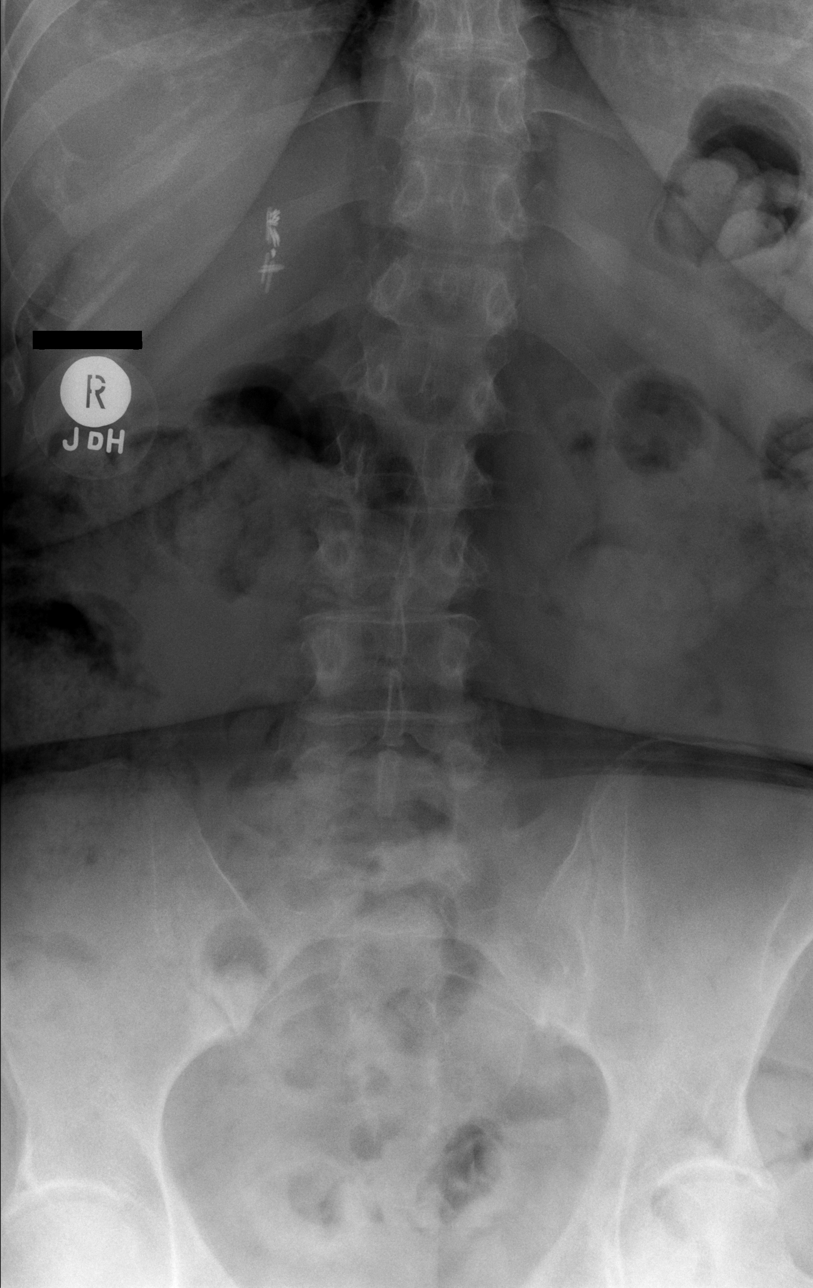

[w lumbar spine obl (1 of 2)]
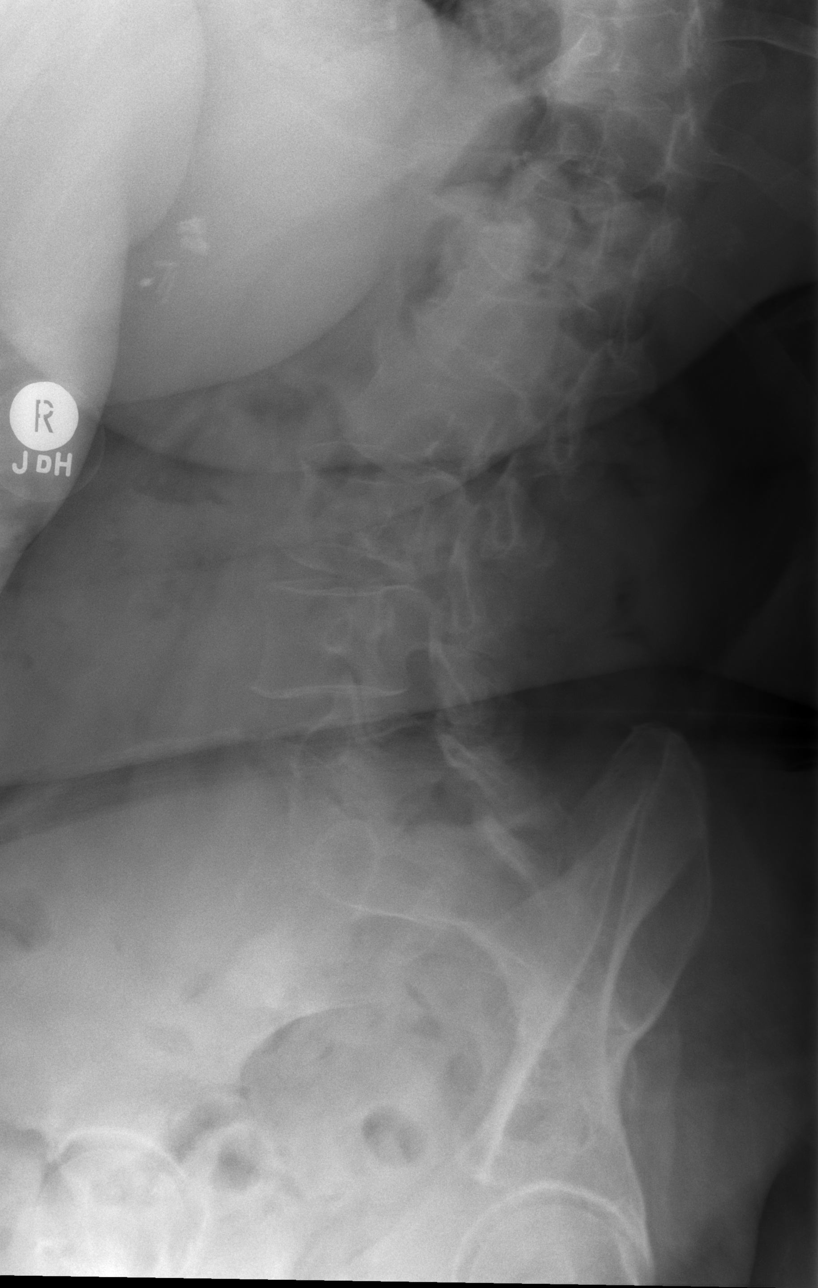

[w lumbar spine obl (2 of 2)]
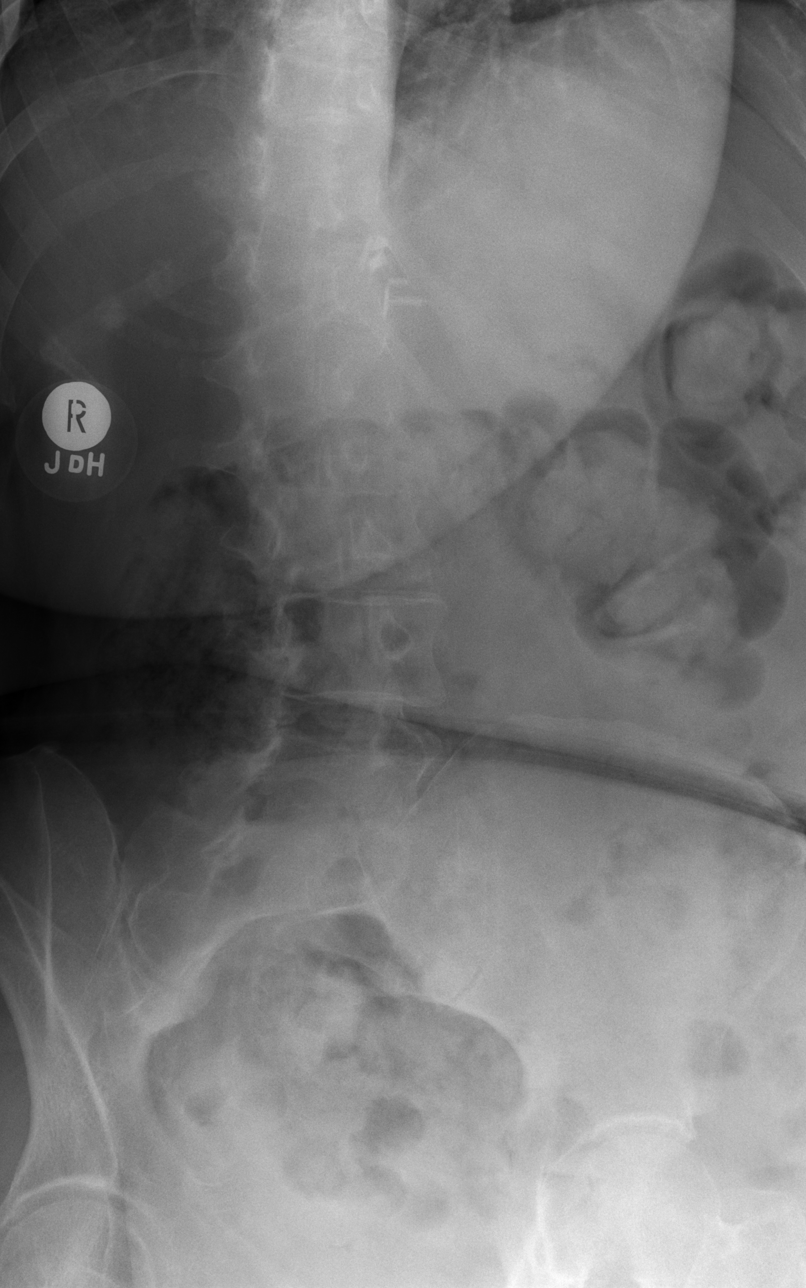

[w lumbar spine lat]
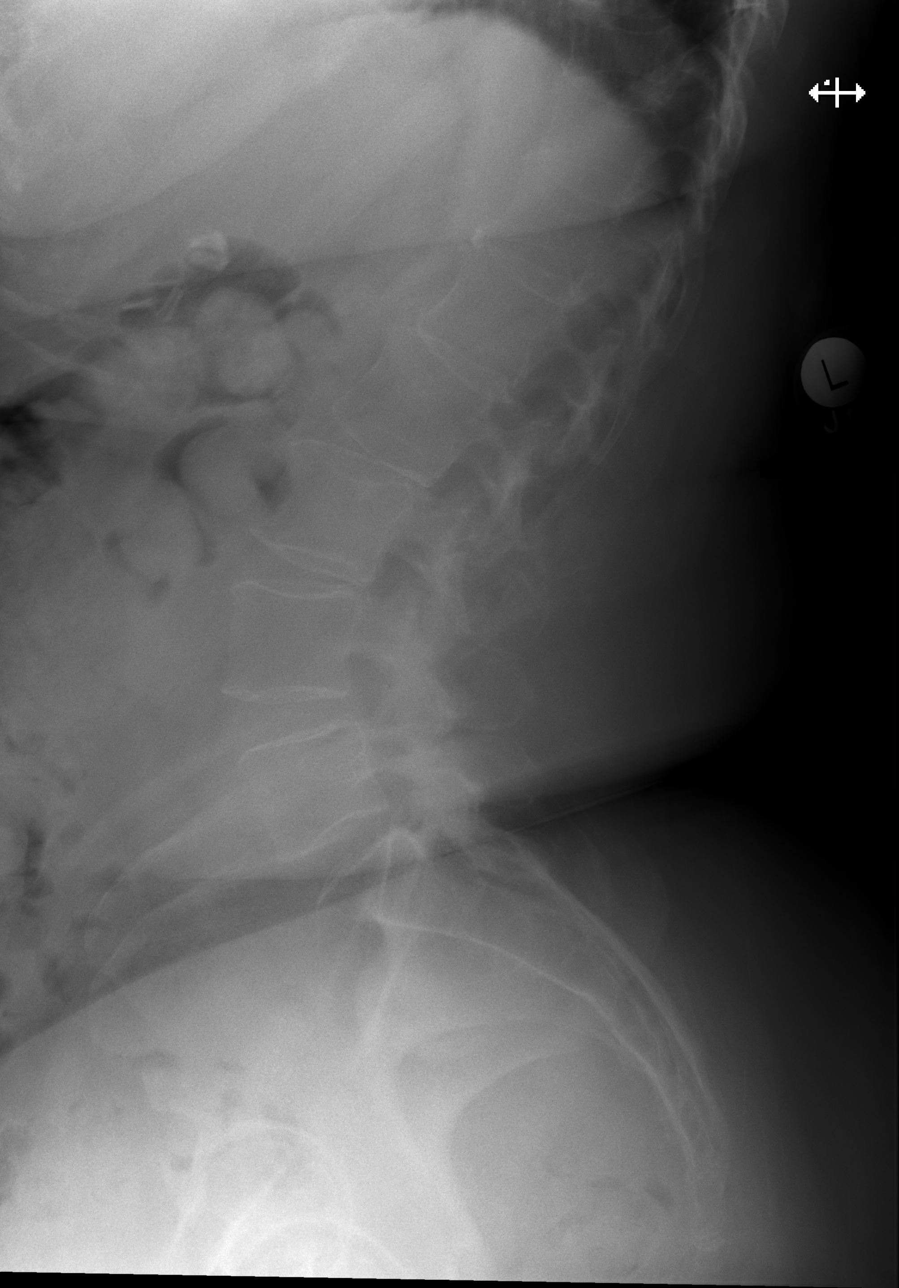

[w lumbar l-5 s-1 spot]
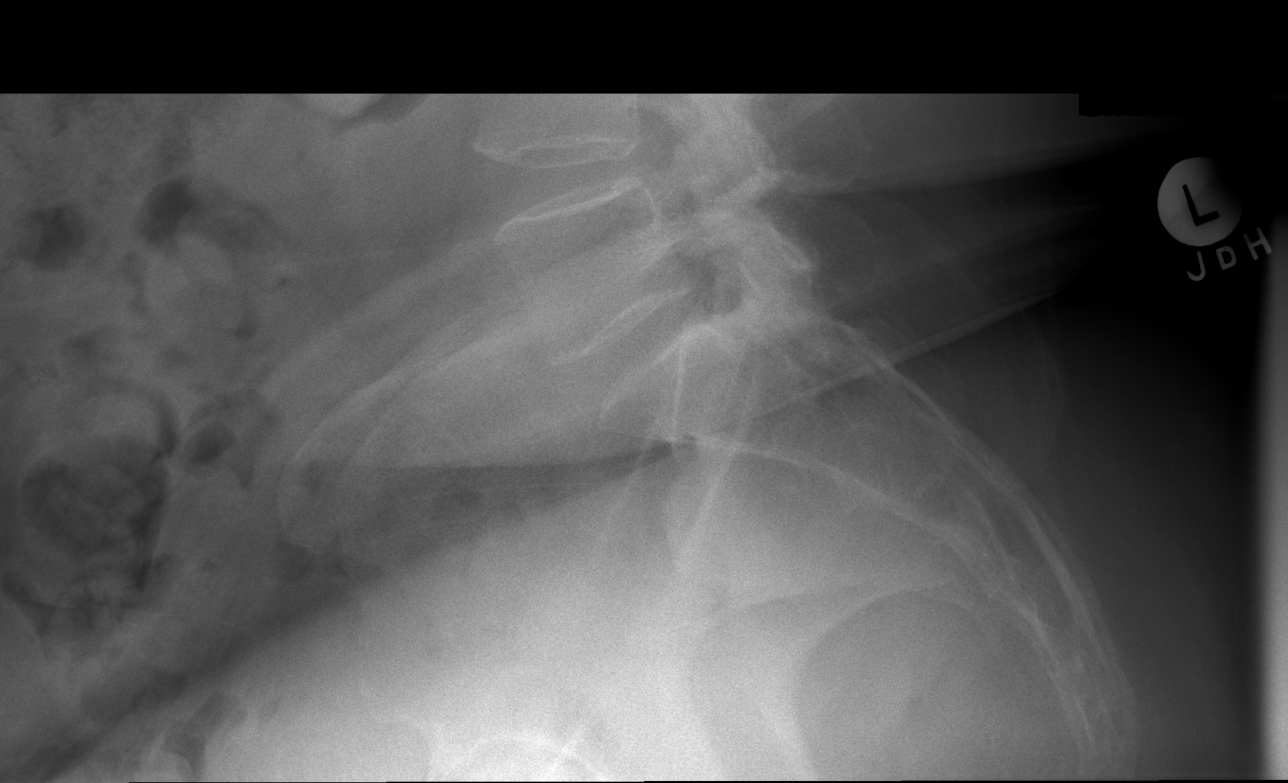

[5 of 5 positions shown; findings below may reference images not displayed]

FINDINGS: Standing frontal, standing lateral, standing spot lumbosacral
lateral, and bilateral oblique views were obtained. There are 5
non-rib-bearing lumbar type vertebral bodies. There is mild upper
lumbar dextroscoliosis. There is no fracture or spondylolisthesis.
There is mild disc space narrowing at L1-2, L2-3, and L3-4. There is
facet osteoarthritic change on the left at L4-5 and L5-S1
bilaterally.
IMPRESSION: Mild scoliosis. Areas of osteoarthritic change. No fracture or
spondylolisthesis.

## 2019-08-03 ENCOUNTER — Other Ambulatory Visit: Payer: Self-pay | Admitting: Obstetrics and Gynecology

## 2019-08-03 DIAGNOSIS — N950 Postmenopausal bleeding: Secondary | ICD-10-CM

## 2019-08-11 ENCOUNTER — Ambulatory Visit
Admission: RE | Admit: 2019-08-11 | Discharge: 2019-08-11 | Disposition: A | Discharge status: Home / Self Care | Payer: 59 | Source: Ambulatory Visit | Attending: Obstetrics and Gynecology | Admitting: Obstetrics and Gynecology

## 2019-08-11 DIAGNOSIS — N950 Postmenopausal bleeding: Secondary | ICD-10-CM

## 2019-08-23 ENCOUNTER — Other Ambulatory Visit: Payer: Self-pay | Admitting: Obstetrics and Gynecology

## 2019-10-04 ENCOUNTER — Encounter (HOSPITAL_BASED_OUTPATIENT_CLINIC_OR_DEPARTMENT_OTHER): Payer: Self-pay | Admitting: Obstetrics and Gynecology

## 2019-10-04 ENCOUNTER — Other Ambulatory Visit: Payer: Self-pay

## 2019-10-08 ENCOUNTER — Other Ambulatory Visit (HOSPITAL_COMMUNITY)
Admission: RE | Admit: 2019-10-08 | Discharge: 2019-10-08 | Disposition: A | Discharge status: Home / Self Care | Payer: 59 | Source: Ambulatory Visit | Attending: Obstetrics and Gynecology | Admitting: Obstetrics and Gynecology

## 2019-10-08 DIAGNOSIS — Z01812 Encounter for preprocedural laboratory examination: Secondary | ICD-10-CM | POA: Insufficient documentation

## 2019-10-08 DIAGNOSIS — Z20822 Contact with and (suspected) exposure to covid-19: Secondary | ICD-10-CM | POA: Insufficient documentation

## 2019-10-08 LAB — SARS CORONAVIRUS 2 (TAT 6-24 HRS): SARS Coronavirus 2: NEGATIVE

## 2019-10-10 ENCOUNTER — Other Ambulatory Visit: Payer: Self-pay

## 2019-10-10 ENCOUNTER — Encounter (HOSPITAL_BASED_OUTPATIENT_CLINIC_OR_DEPARTMENT_OTHER)
Admission: RE | Admit: 2019-10-10 | Discharge: 2019-10-10 | Disposition: A | Discharge status: Home / Self Care | Payer: 59 | Source: Ambulatory Visit | Attending: Obstetrics and Gynecology | Admitting: Obstetrics and Gynecology

## 2019-10-10 DIAGNOSIS — Z01818 Encounter for other preprocedural examination: Secondary | ICD-10-CM | POA: Insufficient documentation

## 2019-10-10 LAB — BASIC METABOLIC PANEL
Anion gap: 8 (ref 5–15)
BUN: 13 mg/dL (ref 6–20)
CO2: 26 mmol/L (ref 22–32)
Calcium: 9.5 mg/dL (ref 8.9–10.3)
Chloride: 104 mmol/L (ref 98–111)
Creatinine, Ser: 0.99 mg/dL (ref 0.44–1.00)
GFR calc Af Amer: >60 mL/min (ref >60)
GFR calc non Af Amer: >60 mL/min (ref >60)
Glucose, Bld: 122 mg/dL — ABNORMAL HIGH (ref 70–99)
Potassium: 4.2 mmol/L (ref 3.5–5.1)
Sodium: 138 mmol/L (ref 135–145)

## 2019-10-10 NOTE — Anesthesia Preprocedure Evaluation (Addendum)
Anesthesia Evaluation  Patient identified by MRN, date of birth, ID band Patient awake    Reviewed: Allergy & Precautions, H&P , NPO status , Patient's Chart, lab work & pertinent test results  Airway Mallampati: III  TM Distance: >3 FB Neck ROM: Full   Comment: Previous LMA X 2 with easy mask airway.   Dental  (+) Poor Dentition, Chipped, Missing,    Pulmonary asthma ,    breath sounds clear to auscultation       Cardiovascular hypertension,  Rhythm:regular Rate:Normal     Neuro/Psych    GI/Hepatic   Endo/Other  diabetes, Type 2Hypothyroidism Morbid obesity  Renal/GU      Musculoskeletal  (+) Arthritis ,   Abdominal   Peds  Hematology   Anesthesia Other Findings   Reproductive/Obstetrics                           Anesthesia Physical Anesthesia Plan  ASA: II  Anesthesia Plan: General   Post-op Pain Management:    Induction: Intravenous  PONV Risk Score and Plan: 3 and Ondansetron, Dexamethasone, Treatment may vary due to age or medical condition and Midazolam  Airway Management Planned: LMA  Additional Equipment:   Intra-op Plan:   Post-operative Plan: Extubation in OR  Informed Consent: I have reviewed the patients History and Physical, chart, labs and discussed the procedure including the risks, benefits and alternatives for the proposed anesthesia with the patient or authorized representative who has indicated his/her understanding and acceptance.       Plan Discussed with: CRNA, Anesthesiologist and Surgeon  Anesthesia Plan Comments:         Anesthesia Quick Evaluation

## 2019-10-10 NOTE — Progress Notes (Signed)
Consult done with Dr. Ambrose Pancoast, ok to proceed as scheduled. EKG reviewed.

## 2019-10-11 ENCOUNTER — Other Ambulatory Visit: Payer: Self-pay | Admitting: Obstetrics and Gynecology

## 2019-10-11 NOTE — H&P (Deleted)
  The note originally documented on this encounter has been moved the the encounter in which it belongs.  

## 2019-10-11 NOTE — H&P (Signed)
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  Does patient report new onset of COVID symptoms?  No.  Has patient or close contact tested positive for COVID-19?  No , not in the past 2 weeks.         General          59 yo presents for pre-op visit.            Pt is scheduled for hysteroscopy/D&C w/ polypectomy on October 12, 2019 secondary to endometrial polyps and AUB.            U/S performed at [facility] on Aug 11, 2019 revealed uterus measuring 8.4 x 4.4 x 5.2 cm. Questionable small fibroid measuring 2.3 cm. Endometrium measured 7 mm. There is a 7 mm hypoechoic nodular focus identified in endometrial complex suspicious for endometrial polyp. Bilat OV poorly visualized, WNL. No free fluid or adnexal masses seen.            Pathology of EMB performed August 23, 2019 revealed endometrioid polyp w/ no malignancy identified.            H/o DM II. HGB A1c on Jun 20, 2019 was 7.2.            Pelvic exam normal.     Current Medication:      Taking   Robaxin(Methocarbamol) 500 MG Tablet 1 tablet Orally four times a day as needed.      Aleve(Naproxen) 220 MG Tablet 1 tablet with food or milk as needed Orally every 12 hrs.      Flonase(Fluticasone Propionate) 50 MCG/ACT Suspension 1 spray in each nostril Nasally Once a day.      Levothyroxine Sodium 137 MCG Tablet 1 tablet on an empty stomach in the morning Orally every other day (alternate with 125 mcg).      Levothyroxine Sodium 125 MCG Tablet 1 tablet on an empty stomach in the morning Orally every other day.      OneTouch Ultra Blue(Blood Glucose Test) 0 Strip USE TO CHECK BLOOD SUGAR ONCE DAILY, ALTERNATING MORNINGS AND EVENINGS BEFORE MEALS AS DIRECTED.      Lisinopril 20 MG Tablet 1 tablet Orally Once a day.      amLODIPine Besylate 5 MG Tablet 1 tablet Orally Once a day.       Albuterol Sulfate HFA 108 (90 Base) MCG/ACT Aerosol Solution 2 puffs as needed Inhalation every 4 hrs, Notes: prn.      Glimepiride 4 MG Tablet 1 tablet with breakfast or the first main meal of the day Orally Once a day.      Trulicity(Dulaglutide) 1.5 MG/0.5ML Solution Pen-injector 0.75 milligrams Subcutaneous once a week, Notes: patient takes 1.5 mg.      Asmanex HFA(Mometasone Furoate) 100 MCG/ACT Aerosol 2 puffs Inhalation once a day.      Medication List reviewed and reconciled with the patient.      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   Periods : irregular.   LMP stopped bleeding a couple weeks ago.   Denies Birth control.   Last pap smear date 05/05/16- negative.   Last mammogram date 07/03/16 - normal .   Denies Abnormal pap smear no.   H/O STD HPV, Condyloma.   Menarche 75.   GYN procedures Endometrial Biopsy, benign (08/23/2019).        OB History:   Number of pregnancies  1.   Pregnancy # 1  live birth, 06/1995, girl, vaginal delivery.        Surgical History:   cholecystectomy, [facility] January 2006      endometrial biopsy, [facility] 2016      Hysteroscopy/polyp removal, [facility] 09/2015      orif r radius fracture,titanium plate [facility]ompson 4/69       Hospitalization:   child birth 06/1995        Family History:   Father: deceased 83 yrs, Hypertension, Parkinson disease, possible late onset diabetes, diagnosed with Diabetes, Hypertension    Mother: deceased 80 yrs, Ovarian cancer, OCD, Brain Cancer, osteoporosis, diagnosed with Ovarian cancer    Brother 1: alive, Hypertension, glaucoma, gerd, OSA, diagnosed with Hypertension    Brother2: alive, diagnosed with Diabetes    Maternal aunt: Uterine cancer, osteoporosis    2 brother(s) . 1 daughter(s) .          cousin breast cancer\n No Family History of Colon Cancer, Polyps.\nPaternal grandmother had liver cancer.     Social History:       General         Tobacco use cigarettes:  Never smoked, Tobacco history last updated  09/27/2019, Additional Findings: Tobacco Non-User  Non-smoker for personal reasons.           no EXPOSURE TO PASSIVE SMOKE.           Alcohol: yes, occasionally, wine, liquor.           Caffeine: yes, coffee, very little, soda, tea, occasionally.           no Recreational drug use.           DIET: good, vegetarian.           Exercise: intermittent.           Marital Status: Married.           Children: 1 daughter.           OCCUPATION: employed, Architect at [employer].      ROS:       CONSTITUTIONAL         Chills  No.  Fatigue  YES.  Fever  No.  Night sweats  No.  Recent travel outside [place]  No.  Sweats  No.  Weight change  No.         OPHTHALMOLOGY         Blurring of vision  no.  Change in vision  no.  Double vision  no.         ENT         Dizziness  no.  Nose bleeds  no.  Sore throat  no.  Teeth pain  no.         ALLERGY         Hives  no.         CARDIOLOGY         Chest pain  no.  High blood pressure  YES.  Irregular heart beat  no.  Leg edema  no.  Palpitations  no.         RESPIRATORY         Shortness of breath  no.  Cough  no.  Wheezing  no.         UROLOGY         Pain with urination  no.  Urinary urgency  YES.  Urinary frequency  YES.  Urinary incontinence  YES.  Difficulty  urinating  No.  Blood in urine  No.         GASTROENTEROLOGY         Abdominal pain  YES.  Appetite change  no.  Bloating/belching  YES.  Blood in stool or on toilet paper  no.  Change in bowel movements  no.  Constipation  no.  Diarrhea  no.  Difficulty swallowing  no.  Nausea  no.         FEMALE REPRODUCTIVE         Vulvar pain  no.  Vulvar rash  no.  Abnormal vaginal bleeding  no.  Breast pain  no.  Nipple discharge  no.  Pain with intercourse  no.  Pelvic pain  no.  Unusual vaginal discharge  no.  Vaginal itching  no.         MUSCULOSKELETAL         Muscle aches  no.         NEUROLOGY         Headache  no.  Tingling/numbness  no.  Weakness  no.         PSYCHOLOGY         Depression  no.  Anxiety  no.  Nervousness  no.  Sleep disturbances  no.  Suicidal ideation  no .         ENDOCRINOLOGY         Excessive thirst  no.  Excessive urination  no.  Hair loss  no.  Heat or cold intolerance  no.         HEMATOLOGY/LYMPH         Abnormal bleeding  no.  Easy bruising  no.  Swollen glands  no.         DERMATOLOGY         New/changing skin lesion  no.  Rash  no.  Sores  no.            Negative except as stated in HPI.   Objective:    Vitals:        Wt 290.0, Wt change 1.2 lb, Ht 64.5, BMI 49.00, Temp 97.8, Pulse sitting 77, BP sitting 128/86.     Past Results:    Examination:          General Examination         CONSTITUTIONAL: alert, oriented, NAD.          SKIN:  moist, warm.  EYES:  Conjunctiva clear.          LUNGS: good I:E efffort noted, CTA bilat.          HEART: RRR.          ABDOMEN: soft, non-tender/non-distended, bowel sounds present.          FEMALE GENITOURINARY: normal external genitalia, labia - unremarkable, vagina - pink moist mucosa, no lesions or abnormal discharge, cervix - no discharge or lesions or CMT, adnexa - no masses or tenderness, uterus - nontender and normal size on palpation.          PSYCH:  affect normal, good eye contact.      Physical  Examination:       Chaperone present          Chaperone present Brothers,Sadie 09/27/2019 08:20:39 AM > , for pelvic exam.           Pt aware of scribe services today.    Assessment:     Assessment:    Abnormal uterine bleeding (AUB) - N93.9 (Primary)      Endometrial polyp - N84.0      Diabetes mellitus - E11.9        Plan:    Treatment:      Abnormal uterine bleeding (AUB)          Notes: Pt is scheduled for hysteroscopy/D&C w/ polypectomy on October 12, 2019 secondary to endometrial polyps and AUB. Discussed w/ pt risks of hysteroscopy including but not limited to infection/bleeding, possible perforation of uterus, with the need for further surgery. Pt advised to avoid NSAIDs (Asprin, Aleve, Advil, Ibuprofen, Motrin) from now until surgery given risk of bleeding during surgery. She is advised to avoid eating or drinking starting midnight prior to surgery. Discussed post-surgery avoidance of driving for 24 hrs and avoidance of intercourse for 2 weeks after procedure. Follow up for 2 wk post-op visit.      Endometrial polyp          Notes: Pt is scheduled for hysteroscopy/D&C w/ polypectomy on October 12, 2019 secondary to endometrial polyps and AUB. Discussed risks and post-surgery avoidance. Follow up for 2 wk post-op visit.

## 2019-10-12 ENCOUNTER — Ambulatory Visit (HOSPITAL_BASED_OUTPATIENT_CLINIC_OR_DEPARTMENT_OTHER)
Admission: RE | Admit: 2019-10-12 | Discharge: 2019-10-12 | Disposition: A | Discharge status: Home / Self Care | Payer: 59 | Attending: Obstetrics and Gynecology | Admitting: Obstetrics and Gynecology

## 2019-10-12 ENCOUNTER — Other Ambulatory Visit: Payer: Self-pay

## 2019-10-12 ENCOUNTER — Encounter (HOSPITAL_BASED_OUTPATIENT_CLINIC_OR_DEPARTMENT_OTHER): Payer: Self-pay | Admitting: Obstetrics and Gynecology

## 2019-10-12 ENCOUNTER — Ambulatory Visit (HOSPITAL_BASED_OUTPATIENT_CLINIC_OR_DEPARTMENT_OTHER): Payer: 59 | Admitting: Anesthesiology

## 2019-10-12 ENCOUNTER — Encounter (HOSPITAL_BASED_OUTPATIENT_CLINIC_OR_DEPARTMENT_OTHER)
Admission: RE | Disposition: A | Discharge status: Home / Self Care | Payer: Self-pay | Source: Home / Self Care | Attending: Obstetrics and Gynecology

## 2019-10-12 DIAGNOSIS — M8580 Other specified disorders of bone density and structure, unspecified site: Secondary | ICD-10-CM | POA: Insufficient documentation

## 2019-10-12 DIAGNOSIS — M1990 Unspecified osteoarthritis, unspecified site: Secondary | ICD-10-CM | POA: Insufficient documentation

## 2019-10-12 DIAGNOSIS — N950 Postmenopausal bleeding: Secondary | ICD-10-CM | POA: Insufficient documentation

## 2019-10-12 DIAGNOSIS — I10 Essential (primary) hypertension: Secondary | ICD-10-CM | POA: Diagnosis not present

## 2019-10-12 DIAGNOSIS — R9431 Abnormal electrocardiogram [ECG] [EKG]: Secondary | ICD-10-CM | POA: Insufficient documentation

## 2019-10-12 DIAGNOSIS — Z7984 Long term (current) use of oral hypoglycemic drugs: Secondary | ICD-10-CM | POA: Insufficient documentation

## 2019-10-12 DIAGNOSIS — E119 Type 2 diabetes mellitus without complications: Secondary | ICD-10-CM | POA: Diagnosis not present

## 2019-10-12 DIAGNOSIS — Z79899 Other long term (current) drug therapy: Secondary | ICD-10-CM | POA: Diagnosis not present

## 2019-10-12 DIAGNOSIS — Z9889 Other specified postprocedural states: Secondary | ICD-10-CM

## 2019-10-12 DIAGNOSIS — G8929 Other chronic pain: Secondary | ICD-10-CM | POA: Insufficient documentation

## 2019-10-12 DIAGNOSIS — R9389 Abnormal findings on diagnostic imaging of other specified body structures: Secondary | ICD-10-CM | POA: Diagnosis not present

## 2019-10-12 DIAGNOSIS — J45909 Unspecified asthma, uncomplicated: Secondary | ICD-10-CM | POA: Insufficient documentation

## 2019-10-12 DIAGNOSIS — M549 Dorsalgia, unspecified: Secondary | ICD-10-CM | POA: Diagnosis not present

## 2019-10-12 DIAGNOSIS — E039 Hypothyroidism, unspecified: Secondary | ICD-10-CM | POA: Diagnosis not present

## 2019-10-12 DIAGNOSIS — F845 Asperger's syndrome: Secondary | ICD-10-CM | POA: Diagnosis not present

## 2019-10-12 DIAGNOSIS — Z6841 Body Mass Index (BMI) 40.0 and over, adult: Secondary | ICD-10-CM | POA: Diagnosis not present

## 2019-10-12 DIAGNOSIS — Z79890 Hormone replacement therapy (postmenopausal): Secondary | ICD-10-CM | POA: Diagnosis not present

## 2019-10-12 HISTORY — PX: DILATATION & CURETTAGE/HYSTEROSCOPY WITH MYOSURE: SHX6511

## 2019-10-12 LAB — CBC
HCT: 45.1 % (ref 36.0–46.0)
Hemoglobin: 14.9 g/dL (ref 12.0–15.0)
MCH: 30.2 pg (ref 26.0–34.0)
MCHC: 33 g/dL (ref 30.0–36.0)
MCV: 91.5 fL (ref 80.0–100.0)
Platelets: 267 10*3/uL (ref 150–400)
RBC: 4.93 MIL/uL (ref 3.87–5.11)
RDW: 14.4 % (ref 11.5–15.5)
WBC: 6.4 10*3/uL (ref 4.0–10.5)
nRBC: 0 % (ref 0.0–0.2)

## 2019-10-12 LAB — GLUCOSE, CAPILLARY
Glucose-Capillary: 132 mg/dL — ABNORMAL HIGH (ref 70–99)
Glucose-Capillary: 96 mg/dL (ref 70–99)

## 2019-10-12 SURGERY — DILATATION & CURETTAGE/HYSTEROSCOPY WITH MYOSURE
Anesthesia: General | Site: Uterus

## 2019-10-12 MED ORDER — MIDAZOLAM HCL 2 MG/2ML IJ SOLN
INTRAMUSCULAR | Status: AC
  Filled 2019-10-12: qty 2

## 2019-10-12 MED ORDER — ONDANSETRON HCL 4 MG/2ML IJ SOLN
INTRAMUSCULAR | Status: AC
  Filled 2019-10-12: qty 2

## 2019-10-12 MED ORDER — BUPIVACAINE HCL (PF) 0.25 % IJ SOLN
INTRAMUSCULAR | Status: DC | PRN
  Administered 2019-10-12: 20 mL

## 2019-10-12 MED ORDER — PROPOFOL 10 MG/ML IV BOLUS
INTRAVENOUS | Status: DC | PRN
  Administered 2019-10-12: 150 mg via INTRAVENOUS

## 2019-10-12 MED ORDER — LACTATED RINGERS IV SOLN: INTRAVENOUS | Status: DC

## 2019-10-12 MED ORDER — ONDANSETRON HCL 4 MG/2ML IJ SOLN
INTRAMUSCULAR | Status: DC | PRN
  Administered 2019-10-12: 4 mg via INTRAVENOUS

## 2019-10-12 MED ORDER — LIDOCAINE HCL 2 % IJ SOLN
INTRAMUSCULAR | Status: AC
  Filled 2019-10-12: qty 20

## 2019-10-12 MED ORDER — OXYCODONE HCL 5 MG PO TABS
5.0000 mg | ORAL_TABLET | Freq: Once | ORAL | Status: AC | PRN
  Administered 2019-10-12: 5 mg via ORAL

## 2019-10-12 MED ORDER — ONDANSETRON HCL 4 MG/2ML IJ SOLN: 4.0000 mg | Freq: Four times a day (QID) | INTRAMUSCULAR | Status: DC | PRN

## 2019-10-12 MED ORDER — DEXAMETHASONE SODIUM PHOSPHATE 10 MG/ML IJ SOLN
INTRAMUSCULAR | Status: AC
  Filled 2019-10-12: qty 1

## 2019-10-12 MED ORDER — MIDAZOLAM HCL 5 MG/5ML IJ SOLN
INTRAMUSCULAR | Status: DC | PRN
  Administered 2019-10-12: 2 mg via INTRAVENOUS

## 2019-10-12 MED ORDER — SODIUM CHLORIDE 0.9 % IR SOLN
Status: DC | PRN
  Administered 2019-10-12: 3000 mL

## 2019-10-12 MED ORDER — VASOPRESSIN 20 UNIT/ML IV SOLN
INTRAVENOUS | Status: AC
  Filled 2019-10-12: qty 1

## 2019-10-12 MED ORDER — SILVER NITRATE-POT NITRATE 75-25 % EX MISC
CUTANEOUS | Status: AC
  Filled 2019-10-12: qty 10

## 2019-10-12 MED ORDER — ACETAMINOPHEN 500 MG PO TABS
ORAL_TABLET | ORAL | Status: AC
  Filled 2019-10-12: qty 2

## 2019-10-12 MED ORDER — FENTANYL CITRATE (PF) 100 MCG/2ML IJ SOLN: 25.0000 ug | INTRAMUSCULAR | Status: DC | PRN

## 2019-10-12 MED ORDER — LIDOCAINE 2% (20 MG/ML) 5 ML SYRINGE
INTRAMUSCULAR | Status: AC
  Filled 2019-10-12: qty 5

## 2019-10-12 MED ORDER — OXYCODONE HCL 5 MG/5ML PO SOLN: 5.0000 mg | Freq: Once | ORAL | Status: AC | PRN

## 2019-10-12 MED ORDER — POVIDONE-IODINE 10 % EX SWAB
2.0000 "application " | Freq: Once | CUTANEOUS | Status: AC
  Administered 2019-10-12: 2 via TOPICAL

## 2019-10-12 MED ORDER — FENTANYL CITRATE (PF) 100 MCG/2ML IJ SOLN
INTRAMUSCULAR | Status: DC | PRN
  Administered 2019-10-12 (×2): 50 ug via INTRAVENOUS

## 2019-10-12 MED ORDER — OXYCODONE HCL 5 MG PO TABS
ORAL_TABLET | ORAL | Status: AC
  Filled 2019-10-12: qty 1

## 2019-10-12 MED ORDER — IBUPROFEN 800 MG PO TABS
800.0000 mg | ORAL_TABLET | Freq: Three times a day (TID) | ORAL | 0 refills | Status: DC | PRN
Start: 2019-10-12 — End: 2023-08-19

## 2019-10-12 MED ORDER — FENTANYL CITRATE (PF) 100 MCG/2ML IJ SOLN
INTRAMUSCULAR | Status: AC
  Filled 2019-10-12: qty 2

## 2019-10-12 MED ORDER — DEXAMETHASONE SODIUM PHOSPHATE 4 MG/ML IJ SOLN
INTRAMUSCULAR | Status: DC | PRN
  Administered 2019-10-12: 5 mg via INTRAVENOUS

## 2019-10-12 MED ORDER — LIDOCAINE 2% (20 MG/ML) 5 ML SYRINGE
INTRAMUSCULAR | Status: DC | PRN
  Administered 2019-10-12: 60 mg via INTRAVENOUS

## 2019-10-12 MED ORDER — ACETAMINOPHEN 500 MG PO TABS
1000.0000 mg | ORAL_TABLET | ORAL | Status: AC
  Administered 2019-10-12: 1000 mg via ORAL

## 2019-10-12 SURGICAL SUPPLY — 17 items
CATH ROBINSON RED A/P 16FR (CATHETERS) IMPLANT
DEVICE MYOSURE LITE (MISCELLANEOUS) ×1 IMPLANT
DEVICE MYOSURE REACH (MISCELLANEOUS) IMPLANT
GAUZE 4X4 16PLY RFD (DISPOSABLE) ×2 IMPLANT
GLOVE BIOGEL M 6.5 STRL (GLOVE) ×2 IMPLANT
GLOVE BIOGEL PI IND STRL 6.5 (GLOVE) ×1 IMPLANT
GLOVE BIOGEL PI IND STRL 7.0 (GLOVE) ×1 IMPLANT
GLOVE BIOGEL PI INDICATOR 6.5 (GLOVE) ×1
GLOVE BIOGEL PI INDICATOR 7.0 (GLOVE) ×1
GOWN STRL REUS W/TWL LRG LVL3 (GOWN DISPOSABLE) ×4 IMPLANT
KIT PROCEDURE FLUENT (KITS) ×2 IMPLANT
PACK VAGINAL MINOR WOMEN LF (CUSTOM PROCEDURE TRAY) ×2 IMPLANT
PAD OB MATERNITY 4.3X12.25 (PERSONAL CARE ITEMS) ×2 IMPLANT
PAD PREP 24X48 CUFFED NSTRL (MISCELLANEOUS) ×2 IMPLANT
SEAL ROD LENS SCOPE MYOSURE (ABLATOR) ×2 IMPLANT
SLEEVE SCD COMPRESS KNEE MED (MISCELLANEOUS) ×2 IMPLANT
TOWEL GREEN STERILE FF (TOWEL DISPOSABLE) ×2 IMPLANT

## 2019-10-12 NOTE — H&P (Signed)
Date of Initial H&P: 10/11/2019 History reviewed, patient examined, no change in status, stable for surgery.

## 2019-10-12 NOTE — Discharge Instructions (Signed)

## 2019-10-12 NOTE — Op Note (Signed)
10/12/2019  1:22 PM  PATIENT:  Tara Johnson  59 y.o. female  PRE-OPERATIVE DIAGNOSIS:  R93.89-Thickened endometrium N95.0-Postmenopausal bleeding  POST-OPERATIVE DIAGNOSIS:  R93.89-Thickened endometrium N95.0-Postmenopausal bleeding  PROCEDURE:  Procedure(s): DILATATION & CURETTAGE/HYSTEROSCOPY, POLYPECTOMY WITH MYOSURE (N/A)  SURGEON:  Surgeon(s) and Role:    Christophe Louis, MD - Primary  PHYSICIAN ASSISTANT:   ASSISTANTS: none   ANESTHESIA:   general  EBL:  minimal   BLOOD ADMINISTERED:none  DRAINS: none   LOCAL MEDICATIONS USED:  MARCAINE     SPECIMEN:  Source of Specimen:  endometrial currettings possible polyp   DISPOSITION OF SPECIMEN:  PATHOLOGY  COUNTS:  YES  TOURNIQUET:  * No tourniquets in log *  DICTATION: .Dragon Dictation  PLAN OF CARE: Discharge to home after PACU  PATIENT DISPOSITION:  PACU - hemodynamically stable.   Delay start of Pharmacological VTE agent (>24hrs) due to surgical blood loss or risk of bleeding: not applicable   Findings:eyrthema noted under pannus. Normal external genitalia normal cervix. Questionable small endometrial polyp.Marland Kitchen atrophic appearing endometrium   Procedure: Patient was taken to the operating room where she was placed under general anesthesia. She was placed in the dorsal lithotomy position. She was prepped and draped in the usual sterile fashion. A speculum was placed into the vaginal vault. The anterior lip of the cervix was grasped with a single-tooth tenaculum. Quarter percent Marcaine was injected at the 4 and 8:00 positions of the cervix. The cervix was then sounded to  7 cm. The cervix was dilated to approximately 6 mm. Mysosure operative  hysteroscope was inserted. The findings noted above. Myosure lite blade was introduced throught the hysteroscope. The endometrial curettings were obtained.   There was no evidence of perforation. Hysteroscope was then removed. Sharp curette was inserted and endometrial curettings  were obtained. The hysteroscope was reinserted. There was no sign of uterine  perforation. .The hysteroscope was removed.  The single-tooth tenaculum was removed from the anterior lip of the cervix. excellent hemostasis was noted. The speculum was removed from the patient's vagina. She was awakened from anesthesia taken care  To the recovery  room awake and in stable condition. Sponge lap and needle counts were correct x2.

## 2019-10-12 NOTE — Anesthesia Procedure Notes (Signed)
Procedure Name: LMA Insertion Date/Time: 10/12/2019 12:55 PM Performed by: Lieutenant Diego, CRNA Pre-anesthesia Checklist: Patient identified, Emergency Drugs available, Suction available and Patient being monitored Patient Re-evaluated:Patient Re-evaluated prior to induction Oxygen Delivery Method: Circle system utilized Preoxygenation: Pre-oxygenation with 100% oxygen Induction Type: IV induction Ventilation: Mask ventilation without difficulty LMA: LMA inserted LMA Size: 4.0 Number of attempts: 1 Placement Confirmation: positive ETCO2 and breath sounds checked- equal and bilateral Tube secured with: Tape Dental Injury: Teeth and Oropharynx as per pre-operative assessment

## 2019-10-12 NOTE — Anesthesia Postprocedure Evaluation (Signed)
Anesthesia Post Note  Patient: Tara Johnson  Procedure(s) Performed: DILATATION & CURETTAGE/HYSTEROSCOPY, POLYPECTOMY WITH MYOSURE (N/A Uterus)     Patient location during evaluation: PACU Anesthesia Type: General Level of consciousness: awake and alert Pain management: pain level controlled Vital Signs Assessment: post-procedure vital signs reviewed and stable Respiratory status: spontaneous breathing, nonlabored ventilation, respiratory function stable and patient connected to nasal cannula oxygen Cardiovascular status: blood pressure returned to baseline and stable Postop Assessment: no apparent nausea or vomiting Anesthetic complications: no   No complications documented.  Last Vitals:  Vitals:   10/12/19 1400 10/12/19 1420  BP: (!) 140/91 (!) 153/93  Pulse: 73 70  Resp: 13 16  Temp:  36.4 C  SpO2: 95% 96%    Last Pain:  Vitals:   10/12/19 1420  TempSrc:   PainSc: 4                  Kamareon Sciandra S

## 2019-10-12 NOTE — Transfer of Care (Signed)
Immediate Anesthesia Transfer of Care Note  Patient: Carlisle Beers  Procedure(s) Performed: DILATATION & CURETTAGE/HYSTEROSCOPY, POLYPECTOMY WITH MYOSURE (N/A Uterus)  Patient Location: PACU  Anesthesia Type:General  Level of Consciousness: awake  Airway & Oxygen Therapy: Patient Spontanous Breathing and Patient connected to nasal cannula oxygen  Post-op Assessment: Report given to RN and Post -op Vital signs reviewed and stable  Post vital signs: Reviewed and stable  Last Vitals:  Vitals Value Taken Time  BP 154/102 10/12/19 1328  Temp 36.6 C 10/12/19 1328  Pulse 75 10/12/19 1329  Resp 16 10/12/19 1329  SpO2 100 % 10/12/19 1329  Vitals shown include unvalidated device data.  Last Pain:  Vitals:   10/12/19 1147  TempSrc: Oral  PainSc: 0-No pain      Patients Stated Pain Goal: 3 (63/01/60 1093)  Complications: No complications documented.

## 2019-10-13 ENCOUNTER — Encounter (HOSPITAL_BASED_OUTPATIENT_CLINIC_OR_DEPARTMENT_OTHER): Payer: Self-pay | Admitting: Obstetrics and Gynecology

## 2019-10-13 LAB — SURGICAL PATHOLOGY

## 2020-04-25 ENCOUNTER — Other Ambulatory Visit: Payer: Self-pay | Admitting: Obstetrics and Gynecology

## 2020-04-25 DIAGNOSIS — Z1231 Encounter for screening mammogram for malignant neoplasm of breast: Secondary | ICD-10-CM

## 2020-06-15 ENCOUNTER — Ambulatory Visit: Payer: 59

## 2020-06-23 ENCOUNTER — Other Ambulatory Visit: Payer: Self-pay

## 2020-06-23 ENCOUNTER — Ambulatory Visit
Admission: RE | Admit: 2020-06-23 | Discharge: 2020-06-23 | Disposition: A | Discharge status: Home / Self Care | Payer: 59 | Source: Ambulatory Visit | Attending: Obstetrics and Gynecology | Admitting: Obstetrics and Gynecology

## 2020-06-23 DIAGNOSIS — Z1231 Encounter for screening mammogram for malignant neoplasm of breast: Secondary | ICD-10-CM

## 2022-05-30 ENCOUNTER — Other Ambulatory Visit: Payer: Self-pay | Admitting: Obstetrics and Gynecology

## 2022-05-30 DIAGNOSIS — Z1231 Encounter for screening mammogram for malignant neoplasm of breast: Secondary | ICD-10-CM

## 2022-06-05 ENCOUNTER — Ambulatory Visit
Admission: RE | Admit: 2022-06-05 | Discharge: 2022-06-05 | Disposition: A | Discharge status: Home / Self Care | Payer: Managed Care, Other (non HMO) | Source: Ambulatory Visit | Attending: Obstetrics and Gynecology | Admitting: Obstetrics and Gynecology

## 2022-06-05 DIAGNOSIS — Z1231 Encounter for screening mammogram for malignant neoplasm of breast: Secondary | ICD-10-CM

## 2023-05-25 ENCOUNTER — Other Ambulatory Visit (HOSPITAL_COMMUNITY)
Admission: RE | Admit: 2023-05-25 | Discharge: 2023-05-25 | Disposition: A | Discharge status: Home / Self Care | Source: Ambulatory Visit | Attending: Obstetrics and Gynecology | Admitting: Obstetrics and Gynecology

## 2023-05-25 DIAGNOSIS — Z01419 Encounter for gynecological examination (general) (routine) without abnormal findings: Secondary | ICD-10-CM | POA: Insufficient documentation

## 2023-05-28 LAB — CYTOLOGY - PAP
Comment: NEGATIVE
Comment: NEGATIVE
Comment: NEGATIVE
Diagnosis: NEGATIVE
HPV 16: NEGATIVE
HPV 18 / 45: NEGATIVE
High risk HPV: POSITIVE — AB

## 2023-07-15 ENCOUNTER — Other Ambulatory Visit: Payer: Self-pay | Admitting: Obstetrics and Gynecology

## 2023-07-15 DIAGNOSIS — Z0000 Encounter for general adult medical examination without abnormal findings: Secondary | ICD-10-CM

## 2023-07-16 DIAGNOSIS — C50919 Malignant neoplasm of unspecified site of unspecified female breast: Secondary | ICD-10-CM

## 2023-07-16 HISTORY — DX: Malignant neoplasm of unspecified site of unspecified female breast: C50.919

## 2023-07-20 ENCOUNTER — Ambulatory Visit
Admission: RE | Admit: 2023-07-20 | Discharge: 2023-07-20 | Disposition: A | Discharge status: Home / Self Care | Source: Ambulatory Visit | Attending: Obstetrics and Gynecology | Admitting: Obstetrics and Gynecology

## 2023-07-20 DIAGNOSIS — Z0000 Encounter for general adult medical examination without abnormal findings: Secondary | ICD-10-CM

## 2023-07-23 ENCOUNTER — Other Ambulatory Visit: Payer: Self-pay | Admitting: Obstetrics and Gynecology

## 2023-07-23 DIAGNOSIS — R928 Other abnormal and inconclusive findings on diagnostic imaging of breast: Secondary | ICD-10-CM

## 2023-08-07 ENCOUNTER — Ambulatory Visit
Admission: RE | Admit: 2023-08-07 | Discharge: 2023-08-07 | Disposition: A | Discharge status: Home / Self Care | Source: Ambulatory Visit | Attending: Obstetrics and Gynecology | Admitting: Obstetrics and Gynecology

## 2023-08-07 ENCOUNTER — Other Ambulatory Visit: Payer: Self-pay | Admitting: Obstetrics and Gynecology

## 2023-08-07 DIAGNOSIS — N6320 Unspecified lump in the left breast, unspecified quadrant: Secondary | ICD-10-CM

## 2023-08-07 DIAGNOSIS — R928 Other abnormal and inconclusive findings on diagnostic imaging of breast: Secondary | ICD-10-CM

## 2023-08-07 HISTORY — PX: BREAST BIOPSY: SHX20

## 2023-08-11 LAB — SURGICAL PATHOLOGY

## 2023-08-12 ENCOUNTER — Telehealth: Payer: Self-pay | Admitting: *Deleted

## 2023-08-12 NOTE — Telephone Encounter (Signed)
 Spoke to patient to confirm upcoming afternoon [facility] clinic appointment on 6/4, paperwork will be sent via email  Gave location and time, also informed patient that the surgeon's office would be calling as well to get information from them similar to the packet that they will be receiving so make sure to do both.  Reminded patient that all providers will be coming to the clinic to see them HERE and if they had any questions to not hesitate to reach back out to myself or their navigators.

## 2023-08-17 ENCOUNTER — Encounter: Payer: Self-pay | Admitting: *Deleted

## 2023-08-17 DIAGNOSIS — C50212 Malignant neoplasm of upper-inner quadrant of left female breast: Secondary | ICD-10-CM | POA: Insufficient documentation

## 2023-08-17 DIAGNOSIS — Z170 Estrogen receptor positive status [ER+]: Secondary | ICD-10-CM | POA: Insufficient documentation

## 2023-08-19 ENCOUNTER — Ambulatory Visit: Attending: General Surgery | Admitting: Physical Therapy

## 2023-08-19 ENCOUNTER — Inpatient Hospital Stay

## 2023-08-19 ENCOUNTER — Inpatient Hospital Stay: Admitting: Genetic Counselor

## 2023-08-19 ENCOUNTER — Encounter: Payer: Self-pay | Admitting: Hematology

## 2023-08-19 ENCOUNTER — Encounter: Payer: Self-pay | Admitting: General Practice

## 2023-08-19 ENCOUNTER — Other Ambulatory Visit: Payer: Self-pay

## 2023-08-19 ENCOUNTER — Encounter: Payer: Self-pay | Admitting: Physical Therapy

## 2023-08-19 ENCOUNTER — Ambulatory Visit: Payer: Self-pay | Admitting: General Surgery

## 2023-08-19 ENCOUNTER — Inpatient Hospital Stay: Attending: Hematology | Admitting: Hematology

## 2023-08-19 ENCOUNTER — Ambulatory Visit
Admission: RE | Admit: 2023-08-19 | Discharge: 2023-08-19 | Disposition: A | Discharge status: Home / Self Care | Source: Ambulatory Visit | Attending: Radiation Oncology | Admitting: Radiation Oncology

## 2023-08-19 VITALS — BP 120/78 | HR 82 | Temp 98.4°F | Resp 14 | Ht 64.5 in | Wt 243.0 lb

## 2023-08-19 DIAGNOSIS — Z8041 Family history of malignant neoplasm of ovary: Secondary | ICD-10-CM | POA: Diagnosis not present

## 2023-08-19 DIAGNOSIS — Z803 Family history of malignant neoplasm of breast: Secondary | ICD-10-CM | POA: Insufficient documentation

## 2023-08-19 DIAGNOSIS — Z170 Estrogen receptor positive status [ER+]: Secondary | ICD-10-CM

## 2023-08-19 DIAGNOSIS — C50212 Malignant neoplasm of upper-inner quadrant of left female breast: Secondary | ICD-10-CM

## 2023-08-19 DIAGNOSIS — R293 Abnormal posture: Secondary | ICD-10-CM | POA: Insufficient documentation

## 2023-08-19 DIAGNOSIS — M8580 Other specified disorders of bone density and structure, unspecified site: Secondary | ICD-10-CM | POA: Diagnosis not present

## 2023-08-19 DIAGNOSIS — Z7985 Long-term (current) use of injectable non-insulin antidiabetic drugs: Secondary | ICD-10-CM | POA: Diagnosis not present

## 2023-08-19 DIAGNOSIS — Z808 Family history of malignant neoplasm of other organs or systems: Secondary | ICD-10-CM | POA: Insufficient documentation

## 2023-08-19 DIAGNOSIS — Z800 Family history of malignant neoplasm of digestive organs: Secondary | ICD-10-CM | POA: Insufficient documentation

## 2023-08-19 DIAGNOSIS — Z1721 Progesterone receptor positive status: Secondary | ICD-10-CM | POA: Insufficient documentation

## 2023-08-19 DIAGNOSIS — Z1732 Human epidermal growth factor receptor 2 negative status: Secondary | ICD-10-CM | POA: Diagnosis not present

## 2023-08-19 DIAGNOSIS — E039 Hypothyroidism, unspecified: Secondary | ICD-10-CM | POA: Insufficient documentation

## 2023-08-19 DIAGNOSIS — B369 Superficial mycosis, unspecified: Secondary | ICD-10-CM | POA: Diagnosis not present

## 2023-08-19 DIAGNOSIS — E119 Type 2 diabetes mellitus without complications: Secondary | ICD-10-CM | POA: Diagnosis not present

## 2023-08-19 LAB — CMP (CANCER CENTER ONLY)
ALT: 11 U/L (ref 0–44)
AST: 14 U/L — ABNORMAL LOW (ref 15–41)
Albumin: 4.2 g/dL (ref 3.5–5.0)
Alkaline Phosphatase: 65 U/L (ref 38–126)
Anion gap: 5 (ref 5–15)
BUN: 15 mg/dL (ref 8–23)
CO2: 30 mmol/L (ref 22–32)
Calcium: 9.7 mg/dL (ref 8.9–10.3)
Chloride: 106 mmol/L (ref 98–111)
Creatinine: 0.83 mg/dL (ref 0.44–1.00)
GFR, Estimated: >60 mL/min (ref >60)
Glucose, Bld: 136 mg/dL — ABNORMAL HIGH (ref 70–99)
Potassium: 4.7 mmol/L (ref 3.5–5.1)
Sodium: 141 mmol/L (ref 135–145)
Total Bilirubin: 0.5 mg/dL (ref 0.0–1.2)
Total Protein: 7.4 g/dL (ref 6.5–8.1)

## 2023-08-19 LAB — CBC WITH DIFFERENTIAL (CANCER CENTER ONLY)
Abs Immature Granulocytes: 0.01 10*3/uL (ref 0.00–0.07)
Basophils Absolute: 0.1 10*3/uL (ref 0.0–0.1)
Basophils Relative: 1 %
Eosinophils Absolute: 0.3 10*3/uL (ref 0.0–0.5)
Eosinophils Relative: 4 %
HCT: 44 % (ref 36.0–46.0)
Hemoglobin: 14.8 g/dL (ref 12.0–15.0)
Immature Granulocytes: 0 %
Lymphocytes Relative: 28 %
Lymphs Abs: 1.8 10*3/uL (ref 0.7–4.0)
MCH: 29.7 pg (ref 26.0–34.0)
MCHC: 33.6 g/dL (ref 30.0–36.0)
MCV: 88.4 fL (ref 80.0–100.0)
Monocytes Absolute: 0.5 10*3/uL (ref 0.1–1.0)
Monocytes Relative: 7 %
Neutro Abs: 3.7 10*3/uL (ref 1.7–7.7)
Neutrophils Relative %: 60 %
Platelet Count: 282 10*3/uL (ref 150–400)
RBC: 4.98 MIL/uL (ref 3.87–5.11)
RDW: 12.6 % (ref 11.5–15.5)
WBC Count: 6.3 10*3/uL (ref 4.0–10.5)
nRBC: 0 % (ref 0.0–0.2)

## 2023-08-19 LAB — GENETIC SCREENING ORDER

## 2023-08-19 MED ORDER — KETOROLAC TROMETHAMINE 15 MG/ML IJ SOLN: 15.0000 mg | Freq: Once | INTRAMUSCULAR | Status: AC

## 2023-08-19 NOTE — Progress Notes (Signed)
 Radiation Oncology         ([phone]) [phone] ________________________________  Name: Tara Johnson        MRN: [id number]  Date of Service: 08/19/2023 DOB: 04/18/1960  WU:[id number], Autry Legions, MD (Inactive)  Caralyn Chandler, MD     REFERRING PHYSICIAN: Lillette Reid III, MD   DIAGNOSIS: The encounter diagnosis was Malignant neoplasm of upper-inner quadrant of left breast in female, estrogen receptor positive (HCC).   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 female seen in the multidisciplinary breast clinic for a new diagnosis of left breast cancer. The patient was noted to have asymmetry on screening mammogram. She proceeded with diagnostic mammogram and ultrasound on 08/07/2023 that showed a 1.1 cm mass in the 9:00 position.  No abnormal lymph nodes were appreciated. Accordingly, patient underwent a biopsy on 08/07/2023 that revealed grade 2  invasive ductal carcinoma that was ER and PR positive and HER2 negative with a Ki-67 1%.   She is seen today to discuss treatment recommendations of her cancer.      PREVIOUS RADIATION THERAPY: No   PAST MEDICAL HISTORY:  Past Medical History:  Diagnosis Date   Arthritis    lower back and bilateral knees   Asperger's syndrome    High functioning   Asthma    associated with upper respiratory infection use inhaler as needed   Autism    high functioning   Breast cancer (HCC)    Cancer (HCC)    precancerous lesions on skin 20 plus   Chronic back pain    Closed fracture of right distal radius    Complication of anesthesia    prolonged sedation   Diabetes mellitus without complication (HCC)    Diabetic neuropathy (HCC)    Family history of breast cancer    Family history of ovarian cancer    Hip pain    Hypertension    Hypothyroidism    Osteopenia    Thyroid  disease    Vitamin D deficiency        PAST SURGICAL HISTORY: Past Surgical  History:  Procedure Laterality Date   BREAST BIOPSY     years ago unsure of side   BREAST BIOPSY Left 08/07/2023   US  LT BREAST BX W LOC DEV 1ST LESION IMG BX SPEC US  GUIDE 08/07/2023 [facility] MAMMOGRAPHY   CHOLECYSTECTOMY     DILATATION & CURETTAGE/HYSTEROSCOPY WITH MYOSURE N/A 09/26/2015   Procedure: DILATATION & CURETTAGE/HYSTEROSCOPY WITH MYOSURE;  Surgeon: Arlee Lace, MD;  Location: [facility];  Service: Gynecology;  Laterality: N/A;  Polypectomy   DILATATION & CURETTAGE/HYSTEROSCOPY WITH MYOSURE N/A 10/12/2019   Procedure: DILATATION & CURETTAGE/HYSTEROSCOPY, POLYPECTOMY WITH MYOSURE;  Surgeon: Arlee Lace, MD;  Location:  SUR[facility]ERY CENTER;  Service: Gynecology;  Laterality: N/A;   OPEN REDUCTION INTERNAL FIXATION (ORIF) DISTAL RADIAL FRACTURE Right 04/19/2018   Procedure: OPEN TREATMENT OF RIGHT DISTAL RADIUS;  Surgeon: Rober Chimera, MD;  Location: [facility];  Service: Orthopedics;  Laterality: Right;     FAMILY HISTORY:  Family History  Problem Relation Age of Onset   Ovarian cancer Mother 42 - 61   Brain cancer Mother    Ovarian cancer Maternal Aunt 78 - 69   Ovarian cancer Maternal Aunt 61 - 69   Brain cancer Paternal Aunt    Cancer Paternal Aunt        unknown   Ovarian  cancer Maternal Grandmother        early 42s   Liver cancer Paternal Grandmother    Breast cancer Cousin 53 - 59   Liver cancer Cousin    Brain cancer Cousin    Bone cancer Cousin    Cancer Cousin        thymoma     SOCIAL HISTORY:  reports that she has never smoked. She has never used smokeless tobacco. She reports current alcohol use. She reports that she does not use drugs.   ALLERGIES: Metformin and related   MEDICATIONS:  Current Outpatient Medications  Medication Sig Dispense Refill   albuterol (PROVENTIL HFA;VENTOLIN HFA) 108 (90 Base) MCG/ACT inhaler Inhale into the lungs every 6 (six) hours as needed for wheezing or shortness of breath.     amLODipine (NORVASC) 5 MG  tablet Take 5 mg by mouth daily.     Fluticasone Furoate (ARNUITY ELLIPTA) 100 MCG/ACT AEPB Inhale 1 puff into the lungs daily.     glimepiride (AMARYL) 4 MG tablet Take 4 mg by mouth daily with breakfast.     glucose blood test strip 1 each by Other route as needed for other. Use as instructed     levothyroxine (SYNTHROID) 112 MCG tablet Take 112 mcg by mouth daily.     levothyroxine (SYNTHROID, LEVOTHROID) 137 MCG tablet Take 137 mcg by mouth daily before breakfast.     lisinopril (PRINIVIL,ZESTRIL) 20 MG tablet Take 20 mg by mouth daily.     methocarbamol (ROBAXIN) 500 MG tablet Take 500 mg by mouth every 6 (six) hours as needed for muscle spasms.     rosuvastatin (CRESTOR) 10 MG tablet Take 10 mg by mouth daily.     Semaglutide (OZEMPIC, 2 MG/DOSE, [place]) Inject 2 mg into the skin once a week.     Vitamin D, Ergocalciferol, (DRISDOL) 1.25 MG (50000 UNIT) CAPS capsule Take 50,000 Units by mouth every 7 (seven) days.     No current facility-administered medications for this encounter.     REVIEW OF SYSTEMS: On review of systems, the patient reports that she is doing well overall. No breast specific complaints are verbalized.        PHYSICAL EXAM:  Wt Readings from Last 3 Encounters:  08/19/23 243 lb (110.2 kg)  10/12/19 (!) 286 lb 2.5 oz (129.8 kg)  04/30/19 282 lb (127.9 kg)   Temp Readings from Last 3 Encounters:  08/19/23 98.4 F (36.9 C) (Temporal)  10/12/19 97.6 F (36.4 C)  04/30/19 98.5 F (36.9 C) (Oral)   BP Readings from Last 3 Encounters:  08/19/23 120/78  10/12/19 (!) 153/93  04/30/19 (!) 144/105   Pulse Readings from Last 3 Encounters:  08/19/23 82  10/12/19 70  04/19/18 89    In general this is a well appearing  female in no acute distress. She's alert and oriented x4 and appropriate throughout the examination. Cardiopulmonary assessment is negative for acute distress and she exhibits normal effort. Bilateral breast exam is deferred.    ECOG = 0  0 -  Asymptomatic (Fully active, able to carry on all predisease activities without restriction)  1 - Symptomatic but completely ambulatory (Restricted in physically strenuous activity but ambulatory and able to carry out work of a light or sedentary nature. For example, light housework, office work)  2 - Symptomatic, <50% in bed during the day (Ambulatory and capable of all self care but unable to carry out any work activities. Up and about more than 50% of waking  hours)  3 - Symptomatic, >50% in bed, but not bedbound (Capable of only limited self-care, confined to bed or chair 50% or more of waking hours)  4 - Bedbound (Completely disabled. Cannot carry on any self-care. Totally confined to bed or chair)  5 - Death   Aurea Blossom MM, Creech RH, Tormey DC, et al. [phone]). "Toxicity and response criteria of the [facility]". Am. Hillard Lowes. Oncol. 5 (6): 649-55    LABORATORY DATA:  Lab Results  Component Value Date   WBC 6.3 08/19/2023   HGB 14.8 08/19/2023   HCT 44.0 08/19/2023   MCV 88.4 08/19/2023   PLT 282 08/19/2023   Lab Results  Component Value Date   NA 141 08/19/2023   K 4.7 08/19/2023   CL 106 08/19/2023   CO2 30 08/19/2023   Lab Results  Component Value Date   ALT 11 08/19/2023   AST 14 (L) 08/19/2023   ALKPHOS 65 08/19/2023   BILITOT 0.5 08/19/2023      RADIOGRAPHY: US  LT BREAST BX W LOC DEV 1ST LESION IMG BX SPEC US  GUIDE Addendum Date: 08/11/2023 ADDENDUM REPORT: 08/11/2023 12:54 ADDENDUM: Pathology revealed GRADE II INVASIVE MODERATELY DIFFERENTIATED DUCTAL ADENOCARCINOMA of the LEFT breast, 9 o'clock, 3cmfn, (heart clip). This was found to be concordant by Dr. Sande Cromer. Pathology results were discussed with the patient by telephone. The patient reported doing well after the biopsy with moderate tenderness at the site. Post biopsy instructions and care were reviewed and questions were answered. The patient was encouraged to call [facility] for any additional concerns. My direct phone number was provided. The patient was referred to [facility] on August 19, 2023. Pathology results reported by Kraig Peru, RN on 08/11/2023. Electronically Signed   By: Sande Cromer M.D.   On: 08/11/2023 12:54   Result Date: 08/11/2023 CLINICAL DATA:  63 year old female presenting for ultrasound-guided biopsy of a screen detected mass in the LEFT breast. EXAM: ULTRASOUND GUIDED LEFT BREAST CORE NEEDLE BIOPSY COMPARISON:  Previous exam(s). PROCEDURE: I met with the patient and we discussed the procedure of ultrasound-guided biopsy, including benefits and alternatives. We discussed the high likelihood of a successful procedure. We discussed the risks of the procedure, including infection, bleeding, tissue injury, clip migration, and inadequate sampling. Informed written consent was given. The usual time-out protocol was performed immediately prior to the procedure. Lesion quadrant: Upper inner quadrant Using sterile technique and 1% Lidocaine  as local anesthetic, under direct ultrasound visualization, a 14 gauge spring-loaded device was used to perform biopsy of a mass in the left breast 9 o'clock position 3 cm from the nipple using an inferior approach. At the conclusion of the procedure, a heart shaped tissue marker clip was deployed into the biopsy cavity. Follow up 2 view mammogram was performed and dictated separately. IMPRESSION: Ultrasound guided biopsy of a mass in the LEFT breast 9 o'clock position. No apparent complications. Electronically Signed: By: Sande Cromer M.D. On: 08/11/2023 08:52   MM CLIP PLACEMENT LEFT Result Date: 08/11/2023 CLINICAL DATA:  Status post ultrasound-guided biopsy of a screen detected suspicious mass in the LEFT breast. EXAM: 3D DIAGNOSTIC LEFT MAMMOGRAM POST ULTRASOUND BIOPSY COMPARISON:  Previous exam(s). ACR Breast Density  Category b: There are scattered areas of fibroglandular density. FINDINGS: 3D Mammographic images were obtained following ultrasound guided biopsy of a mass in the left breast 9 o'clock position. The heart shaped biopsy marking  clip is in expected position at the site of biopsy. Notably, the clip is within the posterior aspect of the mass. IMPRESSION: Appropriate positioning of the heart shaped biopsy marking clip at the site of biopsy in the LEFT breast 9 o'clock position. Final Assessment: Post Procedure Mammograms for Marker Placement Electronically Signed   By: Sande Cromer M.D.   On: 08/11/2023 08:54   MM 3D DIAGNOSTIC MAMMOGRAM UNILATERAL LEFT BREAST Result Date: 08/07/2023 CLINICAL DATA:  Patient was recalled from screening mammogram for a possible left breast mass. EXAM: DIGITAL DIAGNOSTIC UNILATERAL LEFT MAMMOGRAM WITH TOMOSYNTHESIS AND CAD; ULTRASOUND LEFT BREAST LIMITED TECHNIQUE: Left digital diagnostic mammography and breast tomosynthesis was performed. The images were evaluated with computer-aided detection. ; Targeted ultrasound examination of the left breast was performed. COMPARISON:  Previous exam(s). ACR Breast Density Category b: There are scattered areas of fibroglandular density. FINDINGS: Additional imaging of the left breast was performed. There is persistence of an irregular mass with spiculated margins in the medial aspect of the breast. There are no malignant type microcalcifications. On physical exam, I do not palpate a mass in the medial aspect of the left breast. Targeted ultrasound is performed, showing an irregular mass with angulated margins in the left breast at 9 o'clock 3 cm from the nipple measuring 0.8 x 1.1 x 0.8 cm. Sonographic evaluation of the left axilla does not show any enlarged adenopathy. IMPRESSION: Suspicious 1.1 cm mass in the 9 o'clock region of the left breast 3 cm from the nipple. RECOMMENDATION: Ultrasound-guided core biopsy of the mass in the 9 o'clock  region of the left breast is recommended. I have discussed the findings and recommendations with the patient. If applicable, a reminder letter will be sent to the patient regarding the next appointment. BI-RADS CATEGORY  5: Highly suggestive of malignancy. Electronically Signed   By: Dina  Arceo M.D.   On: 08/07/2023 11:47   US  LIMITED ULTRASOUND INCLUDING AXILLA LEFT BREAST  Result Date: 08/07/2023 CLINICAL DATA:  Patient was recalled from screening mammogram for a possible left breast mass. EXAM: DIGITAL DIAGNOSTIC UNILATERAL LEFT MAMMOGRAM WITH TOMOSYNTHESIS AND CAD; ULTRASOUND LEFT BREAST LIMITED TECHNIQUE: Left digital diagnostic mammography and breast tomosynthesis was performed. The images were evaluated with computer-aided detection. ; Targeted ultrasound examination of the left breast was performed. COMPARISON:  Previous exam(s). ACR Breast Density Category b: There are scattered areas of fibroglandular density. FINDINGS: Additional imaging of the left breast was performed. There is persistence of an irregular mass with spiculated margins in the medial aspect of the breast. There are no malignant type microcalcifications. On physical exam, I do not palpate a mass in the medial aspect of the left breast. Targeted ultrasound is performed, showing an irregular mass with angulated margins in the left breast at 9 o'clock 3 cm from the nipple measuring 0.8 x 1.1 x 0.8 cm. Sonographic evaluation of the left axilla does not show any enlarged adenopathy. IMPRESSION: Suspicious 1.1 cm mass in the 9 o'clock region of the left breast 3 cm from the nipple. RECOMMENDATION: Ultrasound-guided core biopsy of the mass in the 9 o'clock region of the left breast is recommended. I have discussed the findings and recommendations with the patient. If applicable, a reminder letter will be sent to the patient regarding the next appointment. BI-RADS CATEGORY  5: Highly suggestive of malignancy. Electronically Signed   By: Dina   Arceo M.D.   On: 08/07/2023 11:47   MM 3D SCREENING MAMMOGRAM BILATERAL BREAST Result Date: 07/22/2023 CLINICAL DATA:  Screening. EXAM: DIGITAL SCREENING BILATERAL MAMMOGRAM WITH TOMOSYNTHESIS AND CAD TECHNIQUE: Bilateral screening digital craniocaudal and mediolateral oblique mammograms were obtained. Bilateral screening digital breast tomosynthesis was performed. The images were evaluated with computer-aided detection. COMPARISON:  Previous exam(s). ACR Breast Density Category b: There are scattered areas of fibroglandular density. FINDINGS: In the left breast, a possible mass warrants further evaluation. In the right breast, no findings suspicious for malignancy. IMPRESSION: Further evaluation is suggested for a possible mass in the left breast. RECOMMENDATION: Diagnostic mammogram and possibly ultrasound of the left breast. (Code:[id number]) The patient will be contacted regarding the findings, and additional imaging will be scheduled. BI-RADS CATEGORY  0: Incomplete: Need additional imaging evaluation. Electronically Signed   By: Sundra Engel M.D.   On: 07/22/2023 12:44       IMPRESSION/PLAN: 1. 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 Jeryl Moris discussed the pathology findings and reviewed the nature of stage I breast cacner. The consensus from the breast conference includes lumpectomy, Oncotype testing, and. Dr. Jeryl Moris recommends external beam radiotherapy to the breast following her lumpectomy to reduce risks of local recurrence. We discussed the risks, benefits, short, and long term effects of radiotherapy, as well as the curative intent, and the patient is interested in proceeding. Dr. Jeryl Moris discussed the delivery and logistics of radiotherapy and anticipates a course of 4 weeks of radiotherapy to the left breast with deep inspiration breath hold technique. We will see her back a few weeks after surgery to discuss the simulation process and  anticipate starting radiotherapy about 4-6 weeks after surgery.     In a visit lasting 60 minutes, greater than 50% of the time was spent face to face reviewing her case, as well as in preparation of, discussing, and coordinating the patient's care.  The above documentation reflects my direct findings during this shared patient visit. Please see the separate note by Dr. Jeryl Moris on this date for the remainder of the patient's plan of care.    Amiel Kalata, [place]    **Disclaimer: This note was dictated with voice recognition software. Similar sounding words can inadvertently be transcribed and this note may contain transcription errors which may not have been corrected upon publication of note.**

## 2023-08-19 NOTE — Progress Notes (Signed)
 REFERRING PROVIDER: Sonja [place]  PRIMARY PROVIDER:  Lysle Saunas, MD (Inactive)  PRIMARY REASON FOR VISIT:  1. Family history of breast cancer   2. Malignant neoplasm of upper-inner quadrant of left breast in female, estrogen receptor positive (HCC)   3. Family history of ovarian cancer      HISTORY OF PRESENT ILLNESS:   Tara Johnson, a 63 y.o. female, was seen for a  cancer genetics consultation at the request of Dr. No ref. provider found due to a personal and family history of cancer.  Tara Johnson presents to clinic today to discuss the possibility of a hereditary predisposition to cancer, genetic testing, and to further clarify her future cancer risks, as well as potential cancer risks for family members.   In May 2025, at the age of 75, Tara Johnson was diagnosed with breast cancer of the left breast.  The patient reports having genetic testing about 20 years ago for hereditary breast cancer syndromes and that it was negative.  We discussed that we would recommend repeating that test if it was longer than 10 years ago.      CANCER HISTORY:  Oncology History  Malignant neoplasm of upper-inner quadrant of left breast in female, estrogen receptor positive (HCC)  08/07/2023 Cancer Staging   Staging form: Breast, AJCC 8th Edition - Clinical stage from 08/07/2023: Stage IA (cT1c, cN0, cM0, G2, ER+, PR+, HER2-) - Signed by Sonja Niles, MD on 08/18/2023 Stage prefix: Initial diagnosis Histologic grading system: 3 grade system   08/17/2023 Initial Diagnosis   Malignant neoplasm of upper-inner quadrant of left breast in female, estrogen receptor positive (HCC)      RISK FACTORS:  Menarche was at age 44.  First live birth at age 23.  OCP use for approximately 10-12 years.  Menopausal status: postmenopausal.  HRT use: 0 years. Colonoscopy: yes; [facility] Aas   Past Medical History:  Diagnosis Date   Arthritis    lower back and bilateral knees   Asperger's syndrome    High functioning   Asthma     associated with upper respiratory infection use inhaler as needed   Autism    high functioning   Breast cancer (HCC)    Cancer (HCC)    precancerous lesions on skin 20 plus   Chronic back pain    Closed fracture of right distal radius    Complication of anesthesia    prolonged sedation   Diabetes mellitus without complication (HCC)    Diabetic neuropathy (HCC)    Family history of breast cancer    Family history of ovarian cancer    Hip pain    Hypertension    Hypothyroidism    Osteopenia    Thyroid  disease    Vitamin D deficiency     Past Surgical History:  Procedure Laterality Date   BREAST BIOPSY     years ago unsure of side   BREAST BIOPSY Left 08/07/2023   US  LT BREAST BX W LOC DEV 1ST LESION IMG BX SPEC US  GUIDE 08/07/2023 [facility]MO[facility]   CHOLECYSTECTOMY     DILATATION & CURETTAGE/HYSTEROSCOPY WITH MYOSURE N/A 09/26/2015   Procedure: DILATATION & CURETTAGE/HYSTEROSCOPY WITH MYOSURE;  Surgeon: Arlee Lace, MD;  Location: [facility];  Service: Gynecology;  Laterality: N/A;  Polypectomy   DILATATION & CURETTAGE/HYSTEROSCOPY WITH MYOSURE N/A 10/12/2019   Procedure: DILATATION & CURETTAGE/HYSTEROSCOPY, POLYPECTOMY WITH MYOSURE;  Surgeon: Arlee Lace, MD;  Location: [facility];  Service: Gynecology;  Laterality: N/A;   OPEN REDUCTION INTERNAL FIXATION (  ORIF) DISTAL RADIAL FRACTURE Right 04/19/2018   Procedure: OPEN TREATMENT OF RIGHT DISTAL RADIUS;  Surgeon: Rober Chimera, MD;  Location:  SUR[facility]ERY [facility];  Service: Orthopedics;  Laterality: Right;    Social History   Socioeconomic History   Marital status: Married    Spouse name: Not on file   Number of children: Not on file   Years of education: Not on file   Highest education level: Not on file  Occupational History   Not on file  Tobacco Use   Smoking status: Never   Smokeless tobacco: Never  Substance and Sexual Activity   Alcohol use: Yes    Comment: rarely   Drug use: No   Sexual  activity: Yes    Birth control/protection: Post-menopausal  Other Topics Concern   Not on file  Social History Narrative   Not on file   Social Drivers of Health   Financial Resource Strain: Not on file  Food Insecurity: Not on file  Transportation Needs: Not on file  Physical Activity: Not on file  Stress: Not on file  Social Connections: Not on file     FAMILY HISTORY:  We obtained a detailed, 4-generation family history.  Significant diagnoses are listed below: Family History  Problem Relation Age of Onset   Ovarian cancer Mother 18 - 9   Brain cancer Mother    Ovarian cancer Maternal Aunt 62 - 69   Ovarian cancer Maternal Aunt 50 - 69   Brain cancer Paternal Aunt    Cancer Paternal Aunt        unknown   Ovarian cancer Maternal Grandmother        early 24s   Liver cancer Paternal Grandmother    Breast cancer Cousin 25 - 59   Liver cancer Cousin    Brain cancer Cousin    Bone cancer Cousin    Cancer Cousin        thymoma     The patient has one daughter who is cancer free.  She has two brothers who are cancer free. Both parenst are deceased.  The patient's mother had ovarian cancer and brain cancer.  She had four sisters, two had ovarian cancer, and one had a daughter with brain cancer.  The maternal grandmother had ovarian cancer.  The patient's father had a sister with brain cancer and a sister with an unknown cancer.  The latter has a daughter with breast cancer and son with liver cancer.  His brother had a son with a thymoma.  The paternal grandmother had liver cancer.  Tara Johnson is aware of previous family history of genetic testing for hereditary cancer risks.There is no reported Ashkenazi Jewish ancestry. There is no known consanguinity.  GENETIC COUNSELING ASSESSMENT: Tara Johnson is a 63 y.o. female with a personal and family history of cancer which is somewhat suggestive of a hereditary cancer syndrome and predisposition to cancer given the combination of  cancer. We, therefore, discussed and recommended the following at today's visit.   DISCUSSION: We discussed that, in general, most cancer is not inherited in families, but instead is sporadic or familial. Sporadic cancers occur by chance and typically happen at older ages (>50 years) as this type of cancer is caused by genetic changes acquired during an individual's lifetime. Some families have more cancers than would be expected by chance; however, the ages or types of cancer are not consistent with a known genetic mutation or known genetic mutations have been ruled out. This type of  familial cancer is thought to be due to a combination of multiple genetic, environmental, hormonal, and lifestyle factors. While this combination of factors likely increases the risk of cancer, the exact source of this risk is not currently identifiable or testable.  We discussed that 5 - 10% of breast cancer is hereditary, with most cases associated with BRCA mutaitons.  There are other genes that can be associated with hereditary breast cancer syndromes. We discussed that testing is beneficial for several reasons including knowing how to follow individuals after completing their treatment, identifying whether potential treatment options such as PARP inhibitors would be beneficial, and understand if other family members could be at risk for cancer and allow them to undergo genetic testing.   We reviewed the characteristics, features and inheritance patterns of hereditary cancer syndromes. We also discussed genetic testing, including the appropriate family members to test, the process of testing, insurance coverage and turn-around-time for results. We discussed the implications of a negative, positive, carrier and/or variant of uncertain significant result. Tara Johnson  was offered a common hereditary cancer panel (36+ genes) and an expanded pan-cancer panel (70+ genes). Tara Johnson was informed of the benefits and limitations of  each panel, including that expanded pan-cancer panels contain genes that do not have clear management guidelines at this point in time.  We also discussed that as the number of genes included on a panel increases, the chances of variants of uncertain significance increases. In order to get genetic test results in a timely manner so that Tara Johnson can use these genetic test results for surgical decisions, we recommended Tara Johnson pursue genetic testing for the BRCAPlus. Once complete, we recommend Tara Johnson pursue reflex genetic testing to the CancerNext+RNAinsight gene panel.   The [facility] CancerNext+RNAinsight Panel includes sequencing, rearrangement analysis, and RNA analysis for the following 40 genes: APC, ATM, BAP1, BARD1, BMPR1A, BRCA1, BRCA2, BRIP1, CDH1, CDKN2A, CHEK2, FH, FLCN, MET, MLH1, MSH2, MSH6, MUTYH, NF1, NTHL1, PALB2, PMS2, PTEN, RAD51C, RAD51D, RPS20, SMAD4, STK11, TP53, TSC1, TSC2 and VHL (sequencing and deletion/duplication); AXIN2, HOXB13, MBD4, MSH3, POLD1 and POLE (sequencing only); EPCAM and GREM1 (deletion/duplication only). RNA data is routinely analyzed for use in variant interpretation for all genes.   Based on Tara Johnson's personal and family history of cancer, she meets medical criteria for genetic testing. Despite that she meets criteria, she may still have an out of pocket cost.   PLAN: After considering the risks, benefits, and limitations, Tara Johnson provided informed consent to pursue genetic testing and the blood sample was sent to [facility] for analysis of the CancerNext+RNAinsight. Results should be available within approximately 2-3 weeks' time, at which point they will be disclosed by telephone to Tara Johnson, as will any additional recommendations warranted by these results. Tara Johnson will receive a summary of her genetic counseling visit and a copy of her results once available. This information will also be available in Epic.   Lastly, we encouraged Ms.  Johnson to remain in contact with cancer genetics annually so that we can continuously update the family history and inform her of any changes in cancer genetics and testing that may be of benefit for this family.   Tara Johnson questions were answered to her satisfaction today. Our contact information was provided should additional questions or concerns arise. Thank you for the referral and allowing us  to share in the care of your patient.   Roshad Hack P. [place], MS, CGC Licensed, Patent attorney [email or web address] phone: [phone]  30 minutes were spent on the date of the encounter in service to the patient including preparation, face-to-face consultation, documentation and care coordination.  The patient brought her sister in law. Drs. Johnna Nakai, and/or Gudena were available for questions, if needed..    _______________________________________________________________________ For Office Staff:  Number of people involved in session: 2 Was an Intern/ student involved with case: no

## 2023-08-19 NOTE — Therapy (Signed)
 OUTPATIENT PHYSICAL THERAPY BREAST CANCER BASELINE EVALUATION   Patient Name: Tara Johnson MRN: [id number] DOB:04/21/1960, 63 y.o., female Today's Date: 08/19/2023  END OF SESSION:  PT End of Session - 08/19/23 1416     Visit Number 1    Number of Visits 2    Date for PT Re-Evaluation 10/14/23    PT Start Time 1331    PT Stop Time 1357    PT Time Calculation (min) 26 min    Activity Tolerance Patient tolerated treatment well    Behavior During Therapy WFL for tasks assessed/performed             Past Medical History:  Diagnosis Date   Arthritis    lower back and bilateral knees   Asperger's syndrome    High functioning   Asthma    associated with upper respiratory infection use inhaler as needed   Autism    high functioning   Breast cancer (HCC)    Cancer (HCC)    precancerous lesions on skin 20 plus   Chronic back pain    Closed fracture of right distal radius    Complication of anesthesia    prolonged sedation   Diabetes mellitus without complication (HCC)    Diabetic neuropathy (HCC)    Hip pain    Hypertension    Hypothyroidism    Osteopenia    Thyroid  disease    Vitamin D deficiency    Past Surgical History:  Procedure Laterality Date   BREAST BIOPSY     years ago unsure of side   BREAST BIOPSY Left 08/07/2023   US  LT BREAST BX W LOC DEV 1ST LESION IMG BX SPEC US  GUIDE 08/07/2023 [facility]   CHOLECYSTECTOMY     DILATATION & CURETTAGE/HYSTEROSCOPY WITH MYOSURE N/A 09/26/2015   Procedure: DILATATION & CURETTAGE/HYSTEROSCOPY WITH MYOSURE;  Surgeon: Arlee Lace, MD;  Location: [facility];  Service: Gynecology;  Laterality: N/A;  Polypectomy   DILATATION & CURETTAGE/HYSTEROSCOPY WITH MYOSURE N/A 10/12/2019   Procedure: DILATATION & CURETTAGE/HYSTEROSCOPY, POLYPECTOMY WITH MYOSURE;  Surgeon: Arlee Lace, MD;  Location: [facility];  Service: Gynecology;  Laterality: N/A;   OPEN REDUCTION INTERNAL FIXATION (ORIF) DISTAL RADIAL FRACTURE Right  04/19/2018   Procedure: OPEN TREATMENT OF RIGHT DISTAL RADIUS;  Surgeon: Rober Chimera, MD;  Location:  S[facility]ERY [facility];  Service: Orthopedics;  Laterality: Right;   Patient Active Problem List   Diagnosis Date Noted   Malignant neoplasm of upper-inner quadrant of left breast in female, estrogen receptor positive (HCC) 08/17/2023   Postmenopausal bleeding 09/26/2015   Endometrial polyp 09/26/2015    REFERRING PROVIDER: Dr. Lillette Reid  REFERRING DIAG: Left breast cancer  THERAPY DIAG:  Malignant neoplasm of upper-inner quadrant of left breast in female, estrogen receptor positive (HCC)  Abnormal posture  Rationale for Evaluation and Treatment: Rehabilitation  ONSET DATE: 07/20/2023  SUBJECTIVE:  SUBJECTIVE STATEMENT: Patient reports she is here today to be seen by her medical team for her newly diagnosed left breast cancer.   PERTINENT HISTORY:  Patient was diagnosed on 07/20/2023 with left grade 2 invasive ductal carcinoma breast cancer. It measures 1.1 cm and is located in the upper inner quadrant. It is ER/PR positive and HER2 negative with a Ki67 of 1%. She reports that she has autism, diabetes and is on Ozempic for weight loss.  PATIENT GOALS:   reduce lymphedema risk and learn post op HEP.   PAIN:  Are you having pain? Yes: NPRS scale: 4/10 Pain location: left inferior breast Pain description: sharp Aggravating factors: laying on her side Relieving factors: unknown  PRECAUTIONS: Active CA   RED FLAGS: None   HAND DOMINANCE: right  WEIGHT BEARING RESTRICTIONS: No  FALLS:  Has patient fallen in last 6 months? Yes. Number of falls 1; fell when she tripped up a curb getting out of the car; denies balance deficits  LIVING ENVIRONMENT: Patient lives with: her husband and 93 y.o.  daughter Lives in: House/apartment Has following equipment at home: None  OCCUPATION: works full time in Audiological scientist at a desk  LEISURE: She does not exercise  PRIOR LEVEL OF FUNCTION: Independent   OBJECTIVE: Note: Objective measures were completed at Evaluation unless otherwise noted.  COGNITION: Overall cognitive status: Within functional limits for tasks assessed    POSTURE:  Forward head and rounded shoulders posture  UPPER EXTREMITY AROM/PROM:  A/PROM RIGHT   eval   Shoulder extension 60  Shoulder flexion 137  Shoulder abduction 152  Shoulder internal rotation 69  Shoulder external rotation 71    (Blank rows = not tested)  A/PROM LEFT   eval  Shoulder extension 60  Shoulder flexion 118  Shoulder abduction 152  Shoulder internal rotation 50  Shoulder external rotation 58    (Blank rows = not tested)  CERVICAL AROM: All within normal limits  UPPER EXTREMITY STRENGTH: WFL  LYMPHEDEMA ASSESSMENTS (in cm):   LANDMARK RIGHT   eval  10 cm proximal to olecranon process 37  Olecranon process 26.5  10 cm proximal to ulnar styloid process 22.4  Just proximal to ulnar styloid process 16  Across hand at thumb web space 18.3  At base of 2nd digit 6.3  (Blank rows = not tested)  LANDMARK LEFT   eval  10 cm proximal to olecranon process 37.9  Olecranon process 27.7  10 cm proximal to ulnar styloid process 22.9  Just proximal to ulnar styloid process 16.3  Across hand at thumb web space 18.5  At base of 2nd digit 6.3  (Blank rows = not tested)  L-DEX LYMPHEDEMA SCREENING:  The patient was assessed using the L-Dex machine today to produce a lymphedema index baseline score. The patient will be reassessed on a regular basis (typically every 3 months) to obtain new L-Dex scores. If the score is > 6.5 points away from his/her baseline score indicating onset of subclinical lymphedema, it will be recommended to wear a compression garment for 4 weeks, 12 hours per  day and then be reassessed. If the score continues to be > 6.5 points from baseline at reassessment, we will initiate lymphedema treatment. Assessing in this manner has a 95% rate of preventing clinically significant lymphedema.   L-DEX FLOWSHEETS - 08/19/23 1400       L-DEX LYMPHEDEMA SCREENING   Measurement Type Unilateral    L-DEX MEASUREMENT EXTREMITY Upper Extremity    POSITION  Standing  DOMINANT SIDE Right    At Risk Side Left    BASELINE SCORE (UNILATERAL) 0.7             QUICK DASH SURVEY:  Cindia Crease - 08/19/23 0001     Open a tight or new jar Mild difficulty    Do heavy household chores (wash walls, wash floors) Moderate difficulty    Carry a shopping bag or briefcase No difficulty    Wash your back No difficulty    Use a knife to cut food No difficulty    Recreational activities in which you take some force or impact through your arm, shoulder, or hand (golf, hammering, tennis) No difficulty    During the past week, to what extent has your arm, shoulder or hand problem interfered with your normal social activities with family, friends, neighbors, or groups? Not at all    During the past week, to what extent has your arm, shoulder or hand problem limited your work or other regular daily activities Not at all    Arm, shoulder, or hand pain. None    Tingling (pins and needles) in your arm, shoulder, or hand None    Difficulty Sleeping No difficulty    DASH Score 6.82 %              PATIENT EDUCATION:  Education details: Time spent educating patient on aspects of self-care to maximize post op recovery. Patient was educated on where and how to get a post op compression bra to use to reduce post op edema. Patient was also educated on the use of SOZO screenings and surveillance principles for early identification of lymphedema onset. She was instructed to use the post op pillow in the axilla for pressure and pain relief. Patient educated on lymphedema risk reduction  and post op shoulder/posture HEP. Person educated: Patient Education method: Explanation, Demonstration, Handout Education comprehension: Patient verbalized understanding and returned demonstration  HOME EXERCISE PROGRAM: Patient was instructed today in a home exercise program today for post op shoulder range of motion. These included active assist shoulder flexion in sitting, scapular retraction, wall walking with shoulder abduction, and hands behind head external rotation.  She was encouraged to do these twice a day, holding 3 seconds and repeating 5 times when permitted by her physician.   ASSESSMENT:  CLINICAL IMPRESSION: Patient was diagnosed on 07/20/2023 with left grade 2 invasive ductal carcinoma breast cancer. It measures 1.1 cm and is located in the upper inner quadrant. It is ER/PR positive and HER2 negative with a Ki67 of 1%. She reports that she has autism, diabetes and is on Ozempic for weight loss.Her multidisciplinary medical team met prior to her assessments to determine a recommended treatment plan. She is planning to have a left lumpectomy and sentinel node biopsy followed by Oncotype testing, radiation, and anti-estrogen therapy. She will benefit from a post op PT reassessment to determine needs and from L-Dex screens every 3 months for 2 years to detect subclinical lymphedema.  Pt will benefit from skilled therapeutic intervention to improve on the following deficits: Decreased knowledge of precautions, impaired UE functional use, pain, decreased ROM, postural dysfunction.   PT treatment/interventions: ADL/self-care home management, pt/family education, therapeutic exercise  REHAB POTENTIAL: Excellent  CLINICAL DECISION MAKING: Stable/uncomplicated  EVALUATION COMPLEXITY: Low   GOALS: Goals reviewed with patient? YES  LONG TERM GOALS: (STG=LTG)    Name Target Date Goal status  1 Pt will be able to verbalize understanding of pertinent lymphedema risk reduction  practices  relevant to her dx specifically related to skin care.  Baseline:  No knowledge 08/19/2023 Achieved at eval  2 Pt will be able to return demo and/or verbalize understanding of the post op HEP related to regaining shoulder ROM. Baseline:  No knowledge 08/19/2023 Achieved at eval  3 Pt will be able to verbalize understanding of the importance of viewing the post op After Breast CA Class video for further lymphedema risk reduction education and therapeutic exercise.  Baseline:  No knowledge 08/19/2023 Achieved at eval  4 Pt will demo she has regained full shoulder ROM and function post operatively compared to baselines.  Baseline: See objective measurements taken today. 10/14/2023     PLAN:  PT FREQUENCY/DURATION: EVAL and 1 follow up appointment.   PLAN FOR NEXT SESSION: will reassess 3-4 weeks post op to determine needs.   Patient will follow up at outpatient cancer rehab 3-4 weeks following surgery.  If the patient requires physical therapy at that time, a specific plan will be dictated and sent to the referring physician for approval. The patient was educated today on appropriate basic range of motion exercises to begin post operatively and the importance of viewing the After Breast Cancer class video following surgery.  Patient was educated today on lymphedema risk reduction practices as it pertains to recommendations that will benefit the patient immediately following surgery.  She verbalized good understanding.    Physical Therapy Information for After Breast Cancer Surgery/Treatment:  Lymphedema is a swelling condition that you may be at risk for in your arm if you have lymph nodes removed from the armpit area.  After a sentinel node biopsy, the risk is approximately 5-9% and is higher after an axillary node dissection.  There is treatment available for this condition and it is not life-threatening.  Contact your physician or physical therapist with concerns. You may begin the 4  shoulder/posture exercises (see additional sheet) when permitted by your physician (typically a week after surgery).  If you have drains, you may need to wait until those are removed before beginning range of motion exercises.  A general recommendation is to not lift your arms above shoulder height until drains are removed.  These exercises should be done to your tolerance and gently.  This is not a "no pain/no gain" type of recovery so listen to your body and stretch into the range of motion that you can tolerate, stopping if you have pain.  If you are having immediate reconstruction, ask your plastic surgeon about doing exercises as he or she may want you to wait. We encourage you to view the After Breast Cancer class video following surgery.  You will learn information related to lymphedema risk, prevention and treatment and additional exercises to regain mobility following surgery.   While undergoing any medical procedure or treatment, try to avoid blood pressure being taken or needle sticks from occurring on the arm on the side of cancer.   This recommendation begins after surgery and continues for the rest of your life.  This may help reduce your risk of getting lymphedema (swelling in your arm). An excellent resource for those seeking information on lymphedema is the National Lymphedema Network's web site. It can be accessed at www.lymphnet.org If you notice swelling in your hand, arm or breast at any time following surgery (even if it is many years from now), please contact your doctor or physical therapist to discuss this.  Lymphedema can be treated at any time but it is easier for you  if it is treated early on.  If you feel like your shoulder motion is not returning to normal in a reasonable amount of time, please contact your surgeon or physical therapist.  [facility] [phone]. [address], [place] [address]  [facility] CLASS After Breast Cancer  Class  After Breast Cancer Class is a specially designed exercise class video to assist you in a safe recover after having breast cancer surgery.  In this video you will learn how to get back to full function whether your drains were just removed or if you had surgery a month ago. The video can be viewed on this page: [email or web address].boyd-meyer.org/ or on YouTube here: [email or web address].  Class Goals  Understand specific stretches to improve the flexibility of you chest and shoulder. Learn ways to safely strengthen your upper body and improve your posture. Understand the warning signs of infection and why you may be at risk for an arm infection. Learn about Lymphedema and prevention.  ** You do not need to view this video until after surgery.  Drains should be removed to participate in the recommended exercises on the video.  Patient was instructed today in a home exercise program today for post op shoulder range of motion. These included active assist shoulder flexion in sitting, scapular retraction, wall walking with shoulder abduction, and hands behind head external rotation.  She was encouraged to do these twice a day, holding 3 seconds and repeating 5 times when permitted by her physician.  Rollin Clock, [place] 08/19/23 2:23 PM

## 2023-08-19 NOTE — Progress Notes (Signed)
 [facility]   [phone] Fax:([phone]   Clinic New Consult Note   Patient Care Team: Lysle Saunas, MD (Inactive) as PCP - General (Internal Medicine) Auther Bo, RN as Oncology Nurse Navigator Alane Hsu, RN as Oncology Nurse Navigator Caralyn Chandler, MD as Consulting Physician (General Surgery) Sonja Moose [address], MD as Consulting Physician (Hematology) Johna Myers, MD as Consulting Physician (Radiation Oncology) 08/19/2023  CHIEF COMPLAINTS/PURPOSE OF CONSULTATION:  Newly diagnosed left breast cancer  REFERRING PHYSICIAN: Breast center  Discussed the use of AI scribe software for clinical note transcription with the patient, who gave verbal consent to proceed.  History of Present Illness Tara Johnson is a 63 year old female with newly discovered breast cancer who presents for a new consult. She is accompanied by her sister-in-law, Tara Johnson.  The breast cancer was discovered on a screening mammogram, with no prior palpable lump or abnormality. It is located in the left breast, measuring 1.1 cm, axillary lymph nodes were negative on ultrasound.  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  She experiences significant fatigue over the past couple of months, impacting her daily activities and requiring rest after work.  Current medications include Ozempic, levothyroxine (recently adjusted from 137 mcg to 112 mcg), and Arnuity Ellipta for asthma. Her past medical history includes diabetes, asthma, thyroid  disease, osteopenia, and vitamin D deficiency. Surgical history includes cholecystectomy, hysteroscopies, and wrist surgery. She is postmenopausal with hot flashes.  Family history is significant for ovarian cancer in her mother, two maternal aunts, and a maternal grandmother. A cousin has breast cancer. She is married, has one child, and does not  smoke or drink. No weight loss beyond what is expected from Ozempic use. No other systemic symptoms.     MEDICAL HISTORY:  Past Medical History:  Diagnosis Date   Arthritis    lower back and bilateral knees   Asperger's syndrome    High functioning   Asthma    associated with upper respiratory infection use inhaler as needed   Autism    high functioning   Breast cancer (HCC)    Cancer (HCC)    precancerous lesions on skin 20 plus   Chronic back pain    Closed fracture of right distal radius    Complication of anesthesia    prolonged sedation   Diabetes mellitus without complication (HCC)    Diabetic neuropathy (HCC)    Family history of breast cancer    Family history of ovarian cancer    Hip pain    Hypertension    Hypothyroidism    Osteopenia    Thyroid  disease    Vitamin D deficiency     SURGICAL HISTORY: Past Surgical History:  Procedure Laterality Date   BREAST BIOPSY     years ago unsure of side   BREAST BIOPSY Left 08/07/2023   US  LT BREAST BX W LOC DEV 1ST LESION IMG BX SPEC US  GUIDE 08/07/2023 [facility] MAMMOGRAPHY   CHOLECYSTECTOMY     DILATATION & CURETTAGE/HYSTEROSCOPY WITH MYOSURE N/A 09/26/2015   Procedure: DILATATION & CURETTAGE/HYSTEROSCOPY WITH MYOSURE;  Surgeon: Arlee Lace, MD;  Location: [facility];  Service: Gynecology;  Laterality: N/A;  Polypectomy   DILATATION & CURETTAGE/HYSTEROSCOPY WITH MYOSURE N/A 10/12/2019   Procedure: DILATATION & CURETTAGE/HYSTEROSCOPY, POLYPECTOMY WITH MYOSURE;  Surgeon: Arlee Lace, MD;  Location: [facility];  Service: Gynecology;  Laterality: N/A;  OPEN REDUCTION INTERNAL FIXATION (ORIF) DISTAL RADIAL FRACTURE Right 04/19/2018   Procedure: OPEN TREATMENT OF RIGHT DISTAL RADIUS;  Surgeon: Rober Chimera, MD;  Location: [facility];  Service: Orthopedics;  Laterality: Right;    SOCIAL HISTORY: Social History   Socioeconomic History   Marital status: Married    Spouse name: Not on file   Number of  children: 1   Years of education: Not on file   Highest education level: Not on file  Occupational History   Not on file  Tobacco Use   Smoking status: Never   Smokeless tobacco: Never  Substance and Sexual Activity   Alcohol use: Yes    Comment: rarely   Drug use: No   Sexual activity: Yes    Birth control/protection: Post-menopausal  Other Topics Concern   Not on file  Social History Narrative   Not on file   Social Drivers of Health   Financial Resource Strain: Not on file  Food Insecurity: No Food Insecurity (08/19/2023)   Hunger Vital Sign    Worried About Running Out of Food in the Last Year: Never true    Ran Out of Food in the Last Year: Never true  Transportation Needs: No Transportation Needs (08/19/2023)   PRAPARE - Administrator, Civil Service (Medical): No    Lack of Transportation (Non-Medical): No  Physical Activity: Not on file  Stress: Not on file  Social Connections: Not on file  Intimate Partner Violence: Not At Risk (08/19/2023)   Humiliation, Afraid, Rape, and Kick questionnaire    Fear of Current or Ex-Partner: No    Emotionally Abused: No    Physically Abused: No    Sexually Abused: No    FAMILY HISTORY: Family History  Problem Relation Age of Onset   Ovarian cancer Mother 62 - 69   Brain cancer Mother    Ovarian cancer Maternal Aunt 34 - 69   Ovarian cancer Maternal Aunt 67 - 69   Brain cancer Paternal Aunt    Cancer Paternal Aunt        unknown   Ovarian cancer Maternal Grandmother        early 49s   Liver cancer Paternal Grandmother    Breast cancer Cousin 46 - 59   Liver cancer Cousin    Brain cancer Cousin    Bone cancer Cousin    Cancer Cousin        thymoma    ALLERGIES:  is allergic to metformin and related.  MEDICATIONS:  Current Outpatient Medications  Medication Sig Dispense Refill   albuterol (PROVENTIL HFA;VENTOLIN HFA) 108 (90 Base) MCG/ACT inhaler Inhale into the lungs every 6 (six) hours as needed for  wheezing or shortness of breath.     amLODipine (NORVASC) 5 MG tablet Take 5 mg by mouth daily.     Fluticasone Furoate (ARNUITY ELLIPTA) 100 MCG/ACT AEPB Inhale 1 puff into the lungs daily.     glimepiride (AMARYL) 4 MG tablet Take 4 mg by mouth daily with breakfast.     levothyroxine (SYNTHROID) 112 MCG tablet Take 112 mcg by mouth daily.     lisinopril (PRINIVIL,ZESTRIL) 20 MG tablet Take 20 mg by mouth daily.     methocarbamol (ROBAXIN) 500 MG tablet Take 500 mg by mouth every 6 (six) hours as needed for muscle spasms.     rosuvastatin (CRESTOR) 10 MG tablet Take 10 mg by mouth daily.     Semaglutide (OZEMPIC, 2 MG/DOSE, [place]) Inject 2 mg  into the skin once a week.     Vitamin D, Ergocalciferol, (DRISDOL) 1.25 MG (50000 UNIT) CAPS capsule Take 50,000 Units by mouth every 7 (seven) days.     glucose blood test strip 1 each by Other route as needed for other. Use as instructed     No current facility-administered medications for this visit.   Facility-Administered Medications Ordered in Other Visits  Medication Dose Route Frequency Provider Last Rate Last Admin   ketorolac  (TORADOL ) 15 MG/ML injection 15 mg  15 mg Intravenous Once Lillette Reid III, MD        REVIEW OF SYSTEMS:   Constitutional: Denies fevers, chills or abnormal night sweats Eyes: Denies blurriness of vision, double vision or watery eyes Ears, nose, mouth, throat, and face: Denies mucositis or sore throat Respiratory: Denies cough, dyspnea or wheezes Cardiovascular: Denies palpitation, chest discomfort or lower extremity swelling Gastrointestinal:  Denies nausea, heartburn or change in bowel habits Skin: Denies abnormal skin rashes Lymphatics: Denies new lymphadenopathy or easy bruising Neurological:Denies numbness, tingling or new weaknesses Behavioral/Psych: Mood is stable, no new changes  All other systems were reviewed with the patient and are negative.  PHYSICAL EXAMINATION: ECOG PERFORMANCE STATUS: 1 - Symptomatic  but completely ambulatory  Vitals:   08/19/23 1232  BP: 120/78  Pulse: 82  Resp: 14  Temp: 98.4 F (36.9 C)  SpO2: 94%   Filed Weights   08/19/23 1232  Weight: 243 lb (110.2 kg)    GENERAL:alert, no distress and comfortable SKIN: skin color, texture, turgor are normal, no rashes or significant lesions EYES: normal, conjunctiva are pink and non-injected, sclera clear OROPHARYNX:no exudate, no erythema and lips, buccal mucosa, and tongue normal  NECK: supple, thyroid  normal size, non-tender, without nodularity LYMPH:  no palpable lymphadenopathy in the cervical, axillary or inguinal LUNGS: clear to auscultation and percussion with normal breathing effort HEART: regular rate & rhythm and no murmurs and no lower extremity edema ABDOMEN:abdomen soft, non-tender and normal bowel sounds Musculoskeletal:no cyanosis of digits and no clubbing  PSYCH: alert & oriented x 3 with fluent speech NEURO: no focal motor/sensory deficits Breasts: Breast inspection showed them to be symmetrical with no nipple discharge. Palpation of the breasts and axilla revealed no obvious mass that I could appreciate.   Physical Exam   LABORATORY DATA:  I have reviewed the data as listed    Latest Ref Rng & Units 08/19/2023   11:54 AM 10/12/2019   12:07 PM 04/04/2018    1:34 PM  CBC  WBC 4.0 - 10.5 K/uL 6.3  6.4  7.8   Hemoglobin 12.0 - 15.0 g/dL 16.1  09.6  04.5   Hematocrit 36.0 - 46.0 % 44.0  45.1  47.0   Platelets 150 - 400 K/uL 282  267  242       Latest Ref Rng & Units 08/19/2023   11:54 AM 10/10/2019   11:55 AM 04/04/2018    1:34 PM  CMP  Glucose 70 - 99 mg/dL 409  811  914   BUN 8 - 23 mg/dL 15  13  11    Creatinine 0.44 - 1.00 mg/dL 7.82  9.56  2.13   Sodium 135 - 145 mmol/L 141  138  137   Potassium 3.5 - 5.1 mmol/L 4.7  4.2  3.9   Chloride 98 - 111 mmol/L 106  104  106   CO2 22 - 32 mmol/L 30  26  23    Calcium 8.9 - 10.3 mg/dL 9.7  9.5  8.9   Total Protein 6.5 - 8.1 g/dL 7.4   7.3    Total Bilirubin 0.0 - 1.2 mg/dL 0.5   0.4   Alkaline Phos 38 - 126 U/L 65   67   AST 15 - 41 U/L 14   29   ALT 0 - 44 U/L 11   26      RADIOGRAPHIC STUDIES: I have personally reviewed the radiological images as listed and agreed with the findings in the report. 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 This was found to be concordant by Dr. Sande Cromer. Pathology results were discussed with the patient by telephone. The patient reported doing well after the biopsy with moderate tenderness at the site. Post biopsy instructions and care were reviewed and questions were answered. The patient was encouraged to call [facility] for any additional concerns. My direct phone number was provided. The patient was referred to [facility] on August 19, 2023. Pathology results reported by Kraig Peru, RN on 08/11/2023. Electronically Signed   By: Sande Cromer M.D.   On: 08/11/2023 12:54   Result Date: 08/11/2023 CLINICAL DATA:  63 year old female presenting for ultrasound-guided biopsy of a screen detected mass in the LEFT breast. EXAM: ULTRASOUND GUIDED LEFT BREAST CORE NEEDLE BIOPSY COMPARISON:  Previous exam(s). PROCEDURE: I met with the patient and we discussed the procedure of ultrasound-guided biopsy, including benefits and alternatives. We discussed the high likelihood of a successful procedure. We discussed the risks of the procedure, including infection, bleeding, tissue injury, clip migration, and inadequate sampling. Informed written consent was given. The usual time-out protocol was performed immediately prior to the procedure. Lesion quadrant: Upper inner quadrant Using  sterile technique and 1% Lidocaine  as local anesthetic, under direct ultrasound visualization, a 14 gauge spring-loaded device was used to perform biopsy of a mass in the left breast 9 o'clock position 3 cm from the nipple using an inferior approach. At the conclusion of the procedure, a heart shaped tissue marker clip was deployed into the biopsy cavity. Follow up 2 view mammogram was performed and dictated separately. IMPRESSION: Ultrasound guided biopsy of a mass in the LEFT breast 9 o'clock position. No apparent complications. Electronically Signed: By: Sande Cromer M.D. On: 08/11/2023 08:52   MM CLIP PLACEMENT LEFT Result Date: 08/11/2023 CLINICAL DATA:  Status post ultrasound-guided biopsy of a screen detected suspicious mass in the LEFT breast. EXAM: 3D DIAGNOSTIC LEFT MAMMOGRAM POST ULTRASOUND BIOPSY COMPARISON:  Previous exam(s). ACR Breast Density Category b: There are scattered areas of fibroglandular density. FINDINGS: 3D Mammographic images were obtained following ultrasound guided biopsy of a mass in the left breast 9 o'clock position. The heart shaped biopsy marking clip is in expected position at the site of biopsy. Notably, the clip is within the posterior aspect of the mass. IMPRESSION: Appropriate positioning of the heart shaped biopsy marking clip at the site of biopsy in the LEFT breast 9 o'clock position. Final Assessment: Post Procedure Mammograms for Marker Placement Electronically Signed   By: Sande Cromer M.D.   On: 08/11/2023 08:54   MM 3D DIAGNOSTIC MAMMOGRAM UNILATERAL LEFT BREAST Result Date: 08/07/2023 CLINICAL DATA:  Patient was recalled from screening mammogram for a possible left breast mass. EXAM:  DIGITAL DIAGNOSTIC UNILATERAL LEFT MAMMOGRAM WITH TOMOSYNTHESIS AND CAD; ULTRASOUND LEFT BREAST LIMITED TECHNIQUE: Left digital diagnostic mammography and breast tomosynthesis was performed. The images were evaluated with computer-aided detection. ; Targeted ultrasound  examination of the left breast was performed. COMPARISON:  Previous exam(s). ACR Breast Density Category b: There are scattered areas of fibroglandular density. FINDINGS: Additional imaging of the left breast was performed. There is persistence of an irregular mass with spiculated margins in the medial aspect of the breast. There are no malignant type microcalcifications. On physical exam, I do not palpate a mass in the medial aspect of the left breast. Targeted ultrasound is performed, showing an irregular mass with angulated margins in the left breast at 9 o'clock 3 cm from the nipple measuring 0.8 x 1.1 x 0.8 cm. Sonographic evaluation of the left axilla does not show any enlarged adenopathy. IMPRESSION: Suspicious 1.1 cm mass in the 9 o'clock region of the left breast 3 cm from the nipple. RECOMMENDATION: Ultrasound-guided core biopsy of the mass in the 9 o'clock region of the left breast is recommended. I have discussed the findings and recommendations with the patient. If applicable, a reminder letter will be sent to the patient regarding the next appointment. BI-RADS CATEGORY  5: Highly suggestive of malignancy. Electronically Signed   By: Dina  Arceo M.D.   On: 08/07/2023 11:47   US  LIMITED ULTRASOUND INCLUDING AXILLA LEFT BREAST  Result Date: 08/07/2023 CLINICAL DATA:  Patient was recalled from screening mammogram for a possible left breast mass. EXAM: DIGITAL DIAGNOSTIC UNILATERAL LEFT MAMMOGRAM WITH TOMOSYNTHESIS AND CAD; ULTRASOUND LEFT BREAST LIMITED TECHNIQUE: Left digital diagnostic mammography and breast tomosynthesis was performed. The images were evaluated with computer-aided detection. ; Targeted ultrasound examination of the left breast was performed. COMPARISON:  Previous exam(s). ACR Breast Density Category b: There are scattered areas of fibroglandular density. FINDINGS: Additional imaging of the left breast was performed. There is persistence of an irregular mass with spiculated margins in  the medial aspect of the breast. There are no malignant type microcalcifications. On physical exam, I do not palpate a mass in the medial aspect of the left breast. Targeted ultrasound is performed, showing an irregular mass with angulated margins in the left breast at 9 o'clock 3 cm from the nipple measuring 0.8 x 1.1 x 0.8 cm. Sonographic evaluation of the left axilla does not show any enlarged adenopathy. IMPRESSION: Suspicious 1.1 cm mass in the 9 o'clock region of the left breast 3 cm from the nipple. RECOMMENDATION: Ultrasound-guided core biopsy of the mass in the 9 o'clock region of the left breast is recommended. I have discussed the findings and recommendations with the patient. If applicable, a reminder letter will be sent to the patient regarding the next appointment. BI-RADS CATEGORY  5: Highly suggestive of malignancy. Electronically Signed   By: Dina  Arceo M.D.   On: 08/07/2023 11:47     Assessment & Plan Malignant neoplasm of upper inner quadrant of left breast, invasive ductal carcinoma, cT1cN0M0, G2, ER+/PR+/HER2- Newly diagnosed invasive ductal carcinoma of the left breast, stage 1A, identified on screening mammogram. Tumor is 1.1 cm, located at 9 o'clock position, 3 cm from the nipple. ER and PR positive, HER2 negative, grade 2. Favorable prognosis with negative lymph nodes on ultrasound. Anticipated low risk of recurrence.  -Oncotype test recommended to assess recurrence risk.  -Discussed treatment options including surgery, radiation, and anti-estrogen therapy. Explained that anti-estrogen therapy reduces risk of recurrence by eliminating estrogen and progesterone, which the tumor cells  express receptors for. Discussed potential side effects of anti-estrogen therapy, including hot flashes, mood swings, joint stiffness, and impact on bone density. Radiation therapy recommended to reduce local recurrence risk. Chemotherapy unlikely needed unless oncotype score indicates high risk. -  Order oncotype test to assess recurrence risk and the need for adjuvant chemotherapy. - Plan for anti-estrogen therapy with anastrozole after completion of radiation therapy. - Schedule follow-up appointment for the last week of radiation therapy to discuss anti-estrogen therapy in detail.  Osteopenia Osteopenia noted. Discussed potential impact of anti-estrogen therapy on bone density. Importance of monitoring bone health emphasized.  Type 2 diabetes mellitus Type 2 diabetes mellitus, managed with Ozempic.  Fungal infection due to moisture Fungal infection due to moisture, particularly in skin folds, related to diabetes. - Advise keeping affected areas dry to prevent fungal infection.  Hypothyroidism Hypothyroidism with recent adjustment in levothyroxine dosage from 137 mcg to 112 mcg due to overmedication. Ongoing monitoring required to achieve optimal dosage.  Plan - Patient will proceed lumpectomy with Dr. Alethea Andes in near future  - Oncotype DX on surgical sample - Patient will proceed with adjuvant radiation - I plan to see her at the end of radiation, to start her on antiestrogen therapy.  Will see her sooner if needed.     No orders of the defined types were placed in this encounter.   All questions were answered. The patient knows to call the clinic with any problems, questions or concerns. I spent 40 minutes counseling the patient face to face. The total time spent in the appointment was 45 minutes including review of chart and various tests results, discussions about plan of care and coordination of care plan.     Sonja Pamlico, MD 08/19/2023

## 2023-08-19 NOTE — Progress Notes (Signed)
 [facility] Spiritual Care Note  Met with Shaneque and her sister-in-law in Breast Multidisciplinary Clinic to introduce Support Center team/resources.  She completed SDOH screening; results follow below.    SDOH Screenings   Food Insecurity: No Food Insecurity (08/19/2023)  Housing: Low Risk  (08/19/2023)  Transportation Needs: No Transportation Needs (08/19/2023)  Utilities: Not At Risk (08/19/2023)  Depression (PHQ2-9): Low Risk  (08/19/2023)  Tobacco Use: Low Risk  (08/19/2023)   Received from [facility] and patient discussed common feelings and emotions when being diagnosed with cancer, and the importance of support during treatment.  Chaplain informed patient of the support team and support services at [facility].  Chaplain provided contact information and encouraged patient to call with any questions or concerns.  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 Being beloved and cared for by so many people is a source of deep meaning for her.  Follow up needed: No. Ms Gordan is aware of ongoing chaplain availability and prefers to reach out as needed/desired because she is so busy with work and caregiving.   [address] Dorice Gardner, [place], [facility] Pager [phone] Voicemail [phone]

## 2023-08-19 NOTE — Research (Signed)
 [employer] 2021-05 - Specimen Collection Study to Evaluate Biomarkers in Subjects with Cancer   Patient Tara Johnson was identified by Dr. Maryalice Smaller as a potential candidate for the above listed study.  This Clinical Research Nurse met with Tara Johnson, [id number], on 08/19/23 in a manner and location that ensures patient privacy to discuss participation in the above listed research study.  Patient is Accompanied by family.  A copy of the informed consent document with embedded HIPAA language was provided to the patient.  Patient reads, speaks, and understands [place].   Patient was provided with the business card of this Nurse and encouraged to contact the research team with any questions.  Approximately 20 minutes were spent with the patient reviewing the informed consent documents.  Patient was provided the option of taking informed consent documents home to review and was encouraged to review at their convenience with their support network, including other care providers. Patient took the consent documents home to review.  Pt was introduced to study and given a copy of the informed consent form to take home to review. Research RN answered pt and family questions in clinic. Rn will call pt on Monday 08-24-22 (pt stated we can call anytime during work day), for follow up on participation in this study. Pt was provided with contact information for RN for further questions or concerns.   [name] BSN RN Clinical Research Nurse [facility] Direct Dial: [phone] 08/19/2023  4:20 PM

## 2023-08-21 ENCOUNTER — Other Ambulatory Visit: Payer: Self-pay | Admitting: General Surgery

## 2023-08-21 ENCOUNTER — Telehealth: Payer: Self-pay | Admitting: *Deleted

## 2023-08-21 ENCOUNTER — Encounter: Payer: Self-pay | Admitting: *Deleted

## 2023-08-21 DIAGNOSIS — C50212 Malignant neoplasm of upper-inner quadrant of left female breast: Secondary | ICD-10-CM

## 2023-08-21 NOTE — Addendum Note (Signed)
 Encounter addended by: Johna Myers, MD on: 08/21/2023 7:35 AM  Actions taken: Clinical Note Signed

## 2023-08-21 NOTE — Telephone Encounter (Signed)
 Spoke to pt concerning [facility] from 08/19/23. Denies questions or concerns regarding dx or treatment care plan. Encourage pt to call with needs. Received verbal understanding.

## 2023-08-24 DIAGNOSIS — C50212 Malignant neoplasm of upper-inner quadrant of left female breast: Secondary | ICD-10-CM

## 2023-08-24 NOTE — Research (Signed)
 [employer] 2021-05 - Specimen Collection Study to Evaluate Biomarkers in Subjects with Cancer   Research RN called pt to follow up on her participation in this study. Pt is interested in this study. Rn scheduled pt to come in for consent and blood draw on 08-28-23 at 1pm. Pt had no questions on this study and participation at this time. Pt will reach out with any questions or concerns.   [name] BSN RN Clinical Research Nurse [facility] Direct Dial: [phone] 08/24/2023  11:57 AM

## 2023-08-26 ENCOUNTER — Encounter (HOSPITAL_BASED_OUTPATIENT_CLINIC_OR_DEPARTMENT_OTHER): Payer: Self-pay | Admitting: General Surgery

## 2023-08-26 ENCOUNTER — Other Ambulatory Visit: Payer: Self-pay

## 2023-08-26 DIAGNOSIS — C50212 Malignant neoplasm of upper-inner quadrant of left female breast: Secondary | ICD-10-CM

## 2023-08-26 NOTE — Research (Signed)
 [employer] 2021-05 - Specimen Collection Study to Evaluate Biomarkers in Subjects with Cancer   Research RN called pt to inquire about her noted history of precancerous skin lesions removed (more than 20 per EMR). Pt stated that she had these moles removed in 1999 and none of them came back as invasive cancer. Pt did state in the conversation that cancer runs in her family. This is noted in Epic as well. She stated she had 9 cousins pass in their 31's with cancer. She also noted her mom had ovarian cancer. Pt had no questions or concerns at this time. RN will meet with patient on 08-28-23 at 1300 for consent and blood draw.   Tara Johnson BSN RN Clinical Research Nurse [facility] Direct Dial: [phone] 08/26/2023  3:59 PM

## 2023-08-27 ENCOUNTER — Encounter (HOSPITAL_BASED_OUTPATIENT_CLINIC_OR_DEPARTMENT_OTHER)
Admission: RE | Admit: 2023-08-27 | Discharge: 2023-08-27 | Disposition: A | Discharge status: Home / Self Care | Source: Ambulatory Visit | Attending: General Surgery | Admitting: General Surgery

## 2023-08-27 DIAGNOSIS — Z794 Long term (current) use of insulin: Secondary | ICD-10-CM | POA: Diagnosis not present

## 2023-08-27 DIAGNOSIS — E119 Type 2 diabetes mellitus without complications: Secondary | ICD-10-CM | POA: Insufficient documentation

## 2023-08-27 DIAGNOSIS — I10 Essential (primary) hypertension: Secondary | ICD-10-CM | POA: Diagnosis not present

## 2023-08-27 DIAGNOSIS — Z01810 Encounter for preprocedural cardiovascular examination: Secondary | ICD-10-CM | POA: Insufficient documentation

## 2023-08-27 MED ORDER — CHLORHEXIDINE GLUCONATE CLOTH 2 % EX PADS: 6.0000 | MEDICATED_PAD | Freq: Once | CUTANEOUS | Status: DC

## 2023-08-27 NOTE — Progress Notes (Signed)

## 2023-08-28 ENCOUNTER — Inpatient Hospital Stay

## 2023-08-28 DIAGNOSIS — C50212 Malignant neoplasm of upper-inner quadrant of left female breast: Secondary | ICD-10-CM

## 2023-08-28 LAB — RESEARCH LABS

## 2023-08-28 NOTE — Research (Signed)
 [employer] 2021-05 - Specimen Collection Study to Evaluate Biomarkers in Subjects with Cancer    This Nurse has reviewed this patient's inclusion and exclusion criteria as a second review and confirms Tara Johnson is eligible for study participation.  Patient may continue with enrollment.  Victory Gravel Marly Schuld, RN, BSN, [facility] She  Her  Hers Clinical Research Nurse [facility] Direct Dial [phone] 08/28/2023 1:23 PM

## 2023-08-28 NOTE — Research (Addendum)
 [employer] 2021-05 - Specimen Collection Study to Evaluate Biomarkers in Subjects with Cancer   Patient Tara Johnson was identified by Dr. Maryalice Smaller as a potential candidate for the above listed study.  This Clinical Research Nurse met with MATINA RODIER, [id number] on 08/28/23 in a manner and location that ensures patient privacy to discuss participation in the above listed research study.  Patient is Unaccompanied.  Patient was previously provided with informed consent documents.  Patient confirmed they have read the informed consent documents.  As outlined in the informed consent form, this Nurse and Anice Barks discussed the purpose of the research study, the investigational nature of the study, study procedures and requirements for study participation, potential risks and benefits of study participation, as well as alternatives to participation.  This study is not blinded or double-blinded. The patient understands participation is voluntary and they may withdraw from study participation at any time.  This study does not involve randomization.  This study does not involve an investigational drug or device. This study does not involve a placebo. Patient understands enrollment is pending full eligibility review.   Confidentiality and how the patient's information will be used as part of study participation were discussed.  Patient was informed there is reimbursement provided for their time and effort spent on trial participation.    All questions were answered to patient's satisfaction.  The informed consent with embedded HIPAA language was reviewed page by page.  The patient's mental and emotional status is appropriate to provide informed consent, and the patient verbalizes an understanding of study participation.  Patient has agreed to participate in the above listed research study and has voluntarily signed the informed consent version dated 15 Apr 2021 with embedded HIPAA language, version dated 39  Jan 2023 on 08/28/23 at 1:12PM.  The patient was provided with a copy of the signed informed consent form with embedded HIPAA language for their reference.  No study specific procedures were obtained prior to the signing of the informed consent document.  Approximately 20 minutes were spent with the patient reviewing the informed consent documents.  Patient was not requested to complete a Release of Information form.  Eligibility  This Nurse has reviewed this patient's inclusion and exclusion criteria and confirmed Tara Johnson is eligible for study participation.  Patient will continue with enrollment.  Eligibility confirmed by treating investigator, who also agrees that patient should proceed with enrollment.  Medical History:  High Blood Pressure  Yes Coronary Artery Disease No Lupus    No Rheumatoid Arthritis  No Diabetes   Yes      If yes, which type?      Type 2 Lynch Syndrome  No  Is the patient currently taking a magnesium supplement?   No  Does the patient have a personal history of cancer (greater than 5 years ago)?  No  Does the patient have a family history of cancer in 1st or 2nd degree relatives? Yes,  If yes, Relationship(s) and Cancer type(s)? Mom had ovarian and brain cancer, maternal grandmother ovarian, three aunts had ovarian cancer on moms side, paternal grandmother had liver cancer, , paternal aunt liver cancer, paternal aunt brain cancer  Does the patient have history of alcohol consumption? No , pt does report she is a former drinker. She quit drinking two years ago after consuming alcohol for about 10 years. Reporting alcohol intake of 1-2 per week socially on average.    Does the patient have history of cigarette, cigar,  pipe, or chewing tobacco use?  No   Blood draw and gift card   After patient signed informed consent her blood was drawn via fresh venipuncture and pt was provided gift card by research specialist.   Pt was thanked for her time and  participation in this study. Pt has contact information of research nurse if concerns or questions arise.   [name] BSN RN Clinical Research Nurse [facility] Direct Dial: [phone] 08/28/2023  2:14 PM

## 2023-08-31 ENCOUNTER — Telehealth: Payer: Self-pay | Admitting: Genetic Counselor

## 2023-08-31 ENCOUNTER — Encounter: Payer: Self-pay | Admitting: Genetic Counselor

## 2023-08-31 DIAGNOSIS — Z1379 Encounter for other screening for genetic and chromosomal anomalies: Secondary | ICD-10-CM | POA: Insufficient documentation

## 2023-08-31 NOTE — Telephone Encounter (Signed)
Revealed negative genetic testing.  Discussed that we do not know why she has breast cancer cancer or why there is cancer in the family. It could be due to a different gene that we are not testing, or maybe our current technology may not be able to pick something up.  It will be important for her to keep in contact with genetics to keep up with whether additional testing may be needed.

## 2023-09-01 ENCOUNTER — Ambulatory Visit: Payer: Self-pay | Admitting: Genetic Counselor

## 2023-09-01 ENCOUNTER — Other Ambulatory Visit: Payer: Self-pay | Admitting: General Surgery

## 2023-09-01 ENCOUNTER — Ambulatory Visit
Admission: RE | Admit: 2023-09-01 | Discharge: 2023-09-01 | Disposition: A | Discharge status: Home / Self Care | Source: Ambulatory Visit | Attending: General Surgery | Admitting: General Surgery

## 2023-09-01 DIAGNOSIS — C50212 Malignant neoplasm of upper-inner quadrant of left female breast: Secondary | ICD-10-CM

## 2023-09-01 DIAGNOSIS — Z1379 Encounter for other screening for genetic and chromosomal anomalies: Secondary | ICD-10-CM

## 2023-09-01 HISTORY — PX: BREAST BIOPSY: SHX20

## 2023-09-01 NOTE — Progress Notes (Signed)
 HPI:  Tara Johnson was previously seen in the [facility] due to a personal and family history of cancer and concerns regarding a hereditary predisposition to cancer. Please refer to our prior cancer genetics clinic note for more information regarding our discussion, assessment and recommendations, at the time. Tara Johnson recent genetic test results were disclosed to her, as were recommendations warranted by these results. These results and recommendations are discussed in more detail below.  CANCER HISTORY:  Oncology History  Malignant neoplasm of upper-inner quadrant of left breast in female, estrogen receptor positive (HCC)  08/07/2023 Cancer Staging   Staging form: Breast, AJCC 8th Edition - Clinical stage from 08/07/2023: Stage IA (cT1c, cN0, cM0, G2, ER+, PR+, HER2-) - Signed by Sonja Limaville, MD on 08/18/2023 Stage prefix: Initial diagnosis Histologic grading system: 3 grade system   08/17/2023 Initial Diagnosis   Malignant neoplasm of upper-inner quadrant of left breast in female, estrogen receptor positive (HCC)   08/31/2023 Genetic Testing   Negative genetic testing on the CancerNext+RNAinsight panel.  The report date is August 29, 2023.  The [facility] CancerNext+RNAinsight Panel includes sequencing, rearrangement analysis, and RNA analysis for the following 40 genes: APC, ATM, BAP1, BARD1, BMPR1A, BRCA1, BRCA2, BRIP1, CDH1, CDKN2A, CHEK2, FH, FLCN, MET, MLH1, MSH2, MSH6, MUTYH, NF1, NTHL1, PALB2, PMS2, PTEN, RAD51C, RAD51D, RPS20, SMAD4, STK11, TP53, TSC1, TSC2 and VHL (sequencing and deletion/duplication); AXIN2, HOXB13, MBD4, MSH3, POLD1 and POLE (sequencing only); EPCAM and GREM1 (deletion/duplication only). RNA data is routinely analyzed for use in variant interpretation for all genes.      FAMILY HISTORY:  We obtained a detailed, 4-generation family history.  Significant diagnoses are listed below: Family History  Problem Relation Age of Onset   Ovarian cancer Mother 20 -  16   Brain cancer Mother    Ovarian cancer Maternal Aunt 73 - 69   Ovarian cancer Maternal Aunt 7 - 69   Brain cancer Paternal Aunt    Cancer Paternal Aunt        unknown   Ovarian cancer Maternal Grandmother        early 41s   Liver cancer Paternal Grandmother    Breast cancer Cousin 41 - 59   Liver cancer Cousin    Brain cancer Cousin    Bone cancer Cousin    Cancer Cousin        thymoma       The patient has one daughter who is cancer free.  She has two brothers who are cancer free. Both parents are deceased.   The patient's mother had ovarian cancer and brain cancer.  She had four sisters, two had ovarian cancer, and one had a daughter with brain cancer.  The maternal grandmother had ovarian cancer.   The patient's father had a sister with brain cancer and a sister with an unknown cancer.  The latter has a daughter with breast cancer and son with liver cancer.  His brother had a son with a thymoma.  The paternal grandmother had liver cancer.   Tara Johnson is aware of previous family history of genetic testing for hereditary cancer risks.There is no reported Ashkenazi Jewish ancestry. There is no known consanguinity.  GENETIC TEST RESULTS: Genetic testing reported out on August 31, 2023 through the CancerNext-Expanded+RNAinsight cancer panel found no pathogenic mutations. The CancerNext-Expanded gene panel offered by [facility] and includes sequencing, rearrangement, and RNA analysis for the following 77 genes: AIP, ALK, APC, ATM, BAP1, BARD1, BMPR1A, BRCA1, BRCA2, BRIP1, CDC73,  CDH1, CDK4, CDKN1B, CDKN2A, CEBPA, CHEK2, CTNNA1, DDX41, DICER1, ETV6, FH, FLCN, GATA2, LZTR1, MAX, MBD4, MEN1, MET, MLH1, MSH2, MSH3, MSH6, MUTYH, NF1, NF2, NTHL1, PALB2, PHOX2B, PMS2, POT1, PRKAR1A, PTCH1, PTEN, RAD51C, RAD51D, RB1, RET, RPS20, RUNX1, SDHA, SDHAF2, SDHB, SDHC, SDHD, SMAD4, SMARCA4, SMARCB1, SMARCE1, STK11, SUFU, TMEM127, TP53, TSC1, TSC2, VHL, and WT1 (sequencing and deletion/duplication);  AXIN2, CTNNA1, DDX41, EGFR, HOXB13, KIT, MBD4, MITF, MSH3, PDGFRA, POLD1 and POLE (sequencing only); EPCAM and GREM1 (deletion/duplication only). RNA data is routinely analyzed for use in variant interpretation for all genes. The test report has been scanned into EPIC and is located under the Molecular Pathology section of the Results Review tab.  A portion of the result report is included below for reference.     We discussed with Tara Johnson that because current genetic testing is not perfect, it is possible there may be a gene mutation in one of these genes that current testing cannot detect, but that chance is small.  We also discussed, that there could be another gene that has not yet been discovered, or that we have not yet tested, that is responsible for the cancer diagnoses in the family. It is also possible there is a hereditary cause for the cancer in the family that Tara Johnson did not inherit and therefore was not identified in her testing.  Therefore, it is important to remain in touch with cancer genetics in the future so that we can continue to offer Tara Johnson the most up to date genetic testing.   ADDITIONAL GENETIC TESTING: We discussed with Tara Johnson that her genetic testing was fairly extensive.  If there are genes identified to increase cancer risk that can be analyzed in the future, we would be happy to discuss and coordinate this testing at that time.    CANCER SCREENING RECOMMENDATIONS: Tara Johnson test result is considered negative (normal).  This means that we have not identified a hereditary cause for her personal and family history of cancer at this time. Most cancers happen by chance and this negative test suggests that her personal and family history of cancer may fall into this category.    Possible reasons for Tara Johnson's negative genetic test include:  1. There may be a gene mutation in one of these genes that current testing methods cannot detect but that chance is small.  2.  There could be another gene that has not yet been discovered, or that we have not yet tested, that is responsible for the cancer diagnoses in the family.  3.  There may be no hereditary risk for cancer in the family. The cancers in Tara Johnson and/or her family may be sporadic/familial or due to other genetic and environmental factors. 4. It is also possible there is a hereditary cause for the cancer in the family that Tara Johnson did not inherit.  Therefore, it is recommended she continue to follow the cancer management and screening guidelines provided by her oncology and primary healthcare provider. An individual's cancer risk and medical management are not determined by genetic test results alone. Overall cancer risk assessment incorporates additional factors, including personal medical history, family history, and any available genetic information that may result in a personalized plan for cancer prevention and surveillance  RECOMMENDATIONS FOR FAMILY MEMBERS:   Since she did not inherit a identifiable mutation in a cancer predisposition gene included on this panel, her children could not have inherited a known mutation from her in one of these genes. Individuals in this  family might be at some increased risk of developing cancer, over the general population risk, simply due to the family history of cancer.  We recommended women in this family have a yearly mammogram beginning at age 66, or 52 years younger than the earliest onset of cancer, an annual clinical breast exam, and perform monthly breast self-exams. Women in this family should also have a gynecological exam as recommended by their primary provider. All family members should be referred for colonoscopy starting at age 71, or 22 years younger than the earliest onset of cancer. It is also possible there is a hereditary cause for the cancer in Tara Johnson's family that she did not inherit and therefore was not identified in her.  Based on Tara Johnson's  family history, we recommended genetic testing in her maternal relatives. Tara Johnson will let us  know if we can be of any assistance in coordinating genetic counseling and/or testing for this family member.   FOLLOW-UP: Lastly, we discussed with Tara Johnson that cancer genetics is a rapidly advancing field and it is possible that new genetic tests will be appropriate for her and/or her family members in the future. We encouraged her to remain in contact with cancer genetics on an annual basis so we can update her personal and family histories and let her know of advances in cancer genetics that may benefit this family.   Our contact number was provided. Tara Johnson questions were answered to her satisfaction, and she knows she is welcome to call us  at anytime with additional questions or concerns.   Marijo Shove, MS, [facility] Licensed, Certified Genetic Counselor [email or web address]

## 2023-09-02 ENCOUNTER — Other Ambulatory Visit: Payer: Self-pay

## 2023-09-02 ENCOUNTER — Encounter (HOSPITAL_BASED_OUTPATIENT_CLINIC_OR_DEPARTMENT_OTHER): Payer: Self-pay | Admitting: General Surgery

## 2023-09-02 ENCOUNTER — Ambulatory Visit (HOSPITAL_BASED_OUTPATIENT_CLINIC_OR_DEPARTMENT_OTHER): Admitting: Anesthesiology

## 2023-09-02 ENCOUNTER — Encounter (HOSPITAL_BASED_OUTPATIENT_CLINIC_OR_DEPARTMENT_OTHER)
Admission: RE | Disposition: A | Discharge status: Home / Self Care | Payer: Self-pay | Source: Home / Self Care | Attending: General Surgery

## 2023-09-02 ENCOUNTER — Ambulatory Visit
Admission: RE | Admit: 2023-09-02 | Discharge: 2023-09-02 | Disposition: A | Discharge status: Home / Self Care | Source: Ambulatory Visit | Attending: General Surgery | Admitting: General Surgery

## 2023-09-02 ENCOUNTER — Ambulatory Visit (HOSPITAL_BASED_OUTPATIENT_CLINIC_OR_DEPARTMENT_OTHER)
Admission: RE | Admit: 2023-09-02 | Discharge: 2023-09-02 | Disposition: A | Discharge status: Home / Self Care | Attending: General Surgery | Admitting: General Surgery

## 2023-09-02 ENCOUNTER — Ambulatory Visit (HOSPITAL_COMMUNITY)
Admission: RE | Admit: 2023-09-02 | Discharge: 2023-09-02 | Disposition: A | Discharge status: Home / Self Care | Source: Ambulatory Visit | Attending: General Surgery | Admitting: General Surgery

## 2023-09-02 DIAGNOSIS — Z170 Estrogen receptor positive status [ER+]: Secondary | ICD-10-CM

## 2023-09-02 DIAGNOSIS — E039 Hypothyroidism, unspecified: Secondary | ICD-10-CM | POA: Insufficient documentation

## 2023-09-02 DIAGNOSIS — Z7984 Long term (current) use of oral hypoglycemic drugs: Secondary | ICD-10-CM | POA: Diagnosis not present

## 2023-09-02 DIAGNOSIS — Z7985 Long-term (current) use of injectable non-insulin antidiabetic drugs: Secondary | ICD-10-CM | POA: Insufficient documentation

## 2023-09-02 DIAGNOSIS — I10 Essential (primary) hypertension: Secondary | ICD-10-CM | POA: Diagnosis not present

## 2023-09-02 DIAGNOSIS — Z1721 Progesterone receptor positive status: Secondary | ICD-10-CM | POA: Insufficient documentation

## 2023-09-02 DIAGNOSIS — E119 Type 2 diabetes mellitus without complications: Secondary | ICD-10-CM | POA: Diagnosis not present

## 2023-09-02 DIAGNOSIS — J4489 Other specified chronic obstructive pulmonary disease: Secondary | ICD-10-CM | POA: Diagnosis not present

## 2023-09-02 DIAGNOSIS — C50212 Malignant neoplasm of upper-inner quadrant of left female breast: Secondary | ICD-10-CM | POA: Diagnosis present

## 2023-09-02 DIAGNOSIS — Z1732 Human epidermal growth factor receptor 2 negative status: Secondary | ICD-10-CM | POA: Insufficient documentation

## 2023-09-02 DIAGNOSIS — Z803 Family history of malignant neoplasm of breast: Secondary | ICD-10-CM | POA: Insufficient documentation

## 2023-09-02 DIAGNOSIS — Z794 Long term (current) use of insulin: Secondary | ICD-10-CM

## 2023-09-02 HISTORY — PX: BREAST LUMPECTOMY WITH RADIOACTIVE SEED AND SENTINEL LYMPH NODE BIOPSY: SHX6550

## 2023-09-02 LAB — GLUCOSE, CAPILLARY
Glucose-Capillary: 125 mg/dL — ABNORMAL HIGH (ref 70–99)
Glucose-Capillary: 137 mg/dL — ABNORMAL HIGH (ref 70–99)

## 2023-09-02 SURGERY — BREAST LUMPECTOMY WITH RADIOACTIVE SEED AND SENTINEL LYMPH NODE BIOPSY
Anesthesia: Regional | Site: Breast | Laterality: Left

## 2023-09-02 MED ORDER — MEPERIDINE HCL 25 MG/ML IJ SOLN: 6.2500 mg | INTRAMUSCULAR | Status: DC | PRN

## 2023-09-02 MED ORDER — ONDANSETRON HCL 4 MG/2ML IJ SOLN
INTRAMUSCULAR | Status: AC
  Filled 2023-09-02: qty 2

## 2023-09-02 MED ORDER — FENTANYL CITRATE (PF) 100 MCG/2ML IJ SOLN
INTRAMUSCULAR | Status: AC
Start: 2023-09-02 — End: 2023-09-02
  Filled 2023-09-02: qty 2

## 2023-09-02 MED ORDER — SCOPOLAMINE 1 MG/3DAYS TD PT72
1.0000 | MEDICATED_PATCH | TRANSDERMAL | Status: DC
  Administered 2023-09-02: 1.5 mg via TRANSDERMAL

## 2023-09-02 MED ORDER — MIDAZOLAM HCL 2 MG/2ML IJ SOLN
2.0000 mg | Freq: Once | INTRAMUSCULAR | Status: AC
  Administered 2023-09-02: 2 mg via INTRAVENOUS

## 2023-09-02 MED ORDER — METOPROLOL TARTRATE 5 MG/5ML IV SOLN
INTRAVENOUS | Status: DC | PRN
Start: 2023-09-02 — End: 2023-09-02
  Administered 2023-09-02 (×2): 1 mg via INTRAVENOUS

## 2023-09-02 MED ORDER — LACTATED RINGERS IV SOLN: INTRAVENOUS | Status: DC

## 2023-09-02 MED ORDER — FENTANYL CITRATE (PF) 100 MCG/2ML IJ SOLN
100.0000 ug | Freq: Once | INTRAMUSCULAR | Status: AC
  Administered 2023-09-02: 100 ug via INTRAVENOUS

## 2023-09-02 MED ORDER — OXYCODONE HCL 5 MG PO TABS: 5.0000 mg | ORAL_TABLET | Freq: Once | ORAL | Status: DC | PRN

## 2023-09-02 MED ORDER — MIDAZOLAM HCL 2 MG/2ML IJ SOLN: 0.5000 mg | Freq: Once | INTRAMUSCULAR | Status: DC | PRN

## 2023-09-02 MED ORDER — BUPIVACAINE-EPINEPHRINE (PF) 0.5% -1:200000 IJ SOLN
INTRAMUSCULAR | Status: DC | PRN
  Administered 2023-09-02: 30 mL

## 2023-09-02 MED ORDER — GLYCOPYRROLATE PF 0.2 MG/ML IJ SOSY
PREFILLED_SYRINGE | INTRAMUSCULAR | Status: AC
  Filled 2023-09-02: qty 1

## 2023-09-02 MED ORDER — BUPIVACAINE-EPINEPHRINE 0.25% -1:200000 IJ SOLN
INTRAMUSCULAR | Status: DC | PRN
  Administered 2023-09-02: 25 mL

## 2023-09-02 MED ORDER — FENTANYL CITRATE (PF) 100 MCG/2ML IJ SOLN
25.0000 ug | INTRAMUSCULAR | Status: DC | PRN
  Administered 2023-09-02 (×3): 50 ug via INTRAVENOUS

## 2023-09-02 MED ORDER — DEXAMETHASONE SODIUM PHOSPHATE 10 MG/ML IJ SOLN
INTRAMUSCULAR | Status: AC
  Filled 2023-09-02: qty 1

## 2023-09-02 MED ORDER — MIDAZOLAM HCL 2 MG/2ML IJ SOLN
INTRAMUSCULAR | Status: AC
  Filled 2023-09-02: qty 2

## 2023-09-02 MED ORDER — DEXAMETHASONE SODIUM PHOSPHATE 10 MG/ML IJ SOLN
INTRAMUSCULAR | Status: DC | PRN
  Administered 2023-09-02: 5 mg via INTRAVENOUS

## 2023-09-02 MED ORDER — ONDANSETRON HCL 4 MG/2ML IJ SOLN
INTRAMUSCULAR | Status: DC | PRN
  Administered 2023-09-02: 4 mg via INTRAVENOUS

## 2023-09-02 MED ORDER — LIDOCAINE 2% (20 MG/ML) 5 ML SYRINGE
INTRAMUSCULAR | Status: AC
  Filled 2023-09-02: qty 5

## 2023-09-02 MED ORDER — GABAPENTIN 100 MG PO CAPS
100.0000 mg | ORAL_CAPSULE | ORAL | Status: AC
  Administered 2023-09-02: 100 mg via ORAL

## 2023-09-02 MED ORDER — PROPOFOL 1000 MG/100ML IV EMUL
INTRAVENOUS | Status: AC
Start: 2023-09-02 — End: 2023-09-02
  Filled 2023-09-02: qty 100

## 2023-09-02 MED ORDER — CEFAZOLIN SODIUM-DEXTROSE 2-4 GM/100ML-% IV SOLN
INTRAVENOUS | Status: AC
  Filled 2023-09-02: qty 100

## 2023-09-02 MED ORDER — VANCOMYCIN HCL 1500 MG/300ML IV SOLN: 1500.0000 mg | INTRAVENOUS | Status: DC

## 2023-09-02 MED ORDER — FENTANYL CITRATE (PF) 100 MCG/2ML IJ SOLN
INTRAMUSCULAR | Status: DC | PRN
  Administered 2023-09-02: 100 ug via INTRAVENOUS

## 2023-09-02 MED ORDER — OXYCODONE HCL 5 MG/5ML PO SOLN: 5.0000 mg | Freq: Once | ORAL | Status: DC | PRN

## 2023-09-02 MED ORDER — TECHNETIUM TC 99M TILMANOCEPT KIT
1.0000 | PACK | Freq: Once | INTRAVENOUS | Status: AC | PRN
  Administered 2023-09-02: 1 via INTRADERMAL

## 2023-09-02 MED ORDER — FENTANYL CITRATE (PF) 100 MCG/2ML IJ SOLN
INTRAMUSCULAR | Status: AC
  Filled 2023-09-02: qty 2

## 2023-09-02 MED ORDER — ACETAMINOPHEN 500 MG PO TABS
ORAL_TABLET | ORAL | Status: AC
Start: 2023-09-02 — End: 2023-09-02
  Filled 2023-09-02: qty 2

## 2023-09-02 MED ORDER — SCOPOLAMINE 1 MG/3DAYS TD PT72
MEDICATED_PATCH | TRANSDERMAL | Status: AC
  Filled 2023-09-02: qty 1

## 2023-09-02 MED ORDER — CEFAZOLIN SODIUM-DEXTROSE 2-4 GM/100ML-% IV SOLN
2.0000 g | INTRAVENOUS | Status: AC
  Administered 2023-09-02: 2 g via INTRAVENOUS

## 2023-09-02 MED ORDER — LIDOCAINE HCL (CARDIAC) PF 100 MG/5ML IV SOSY
PREFILLED_SYRINGE | INTRAVENOUS | Status: DC | PRN
  Administered 2023-09-02: 40 mg via INTRATRACHEAL

## 2023-09-02 MED ORDER — GABAPENTIN 100 MG PO CAPS
ORAL_CAPSULE | ORAL | Status: AC
  Filled 2023-09-02: qty 1

## 2023-09-02 MED ORDER — METOPROLOL TARTRATE 5 MG/5ML IV SOLN
INTRAVENOUS | Status: AC
Start: 2023-09-02 — End: 2023-09-02
  Filled 2023-09-02: qty 5

## 2023-09-02 MED ORDER — 0.9 % SODIUM CHLORIDE (POUR BTL) OPTIME
TOPICAL | Status: DC | PRN
  Administered 2023-09-02: 1000 mL

## 2023-09-02 MED ORDER — PROPOFOL 10 MG/ML IV BOLUS
INTRAVENOUS | Status: DC | PRN
Start: 2023-09-02 — End: 2023-09-02
  Administered 2023-09-02: 200 mg via INTRAVENOUS
  Administered 2023-09-02 (×3): 50 mg via INTRAVENOUS

## 2023-09-02 MED ORDER — PROPOFOL 500 MG/50ML IV EMUL
INTRAVENOUS | Status: DC | PRN
Start: 2023-09-02 — End: 2023-09-02
  Administered 2023-09-02: 75 ug/kg/min via INTRAVENOUS

## 2023-09-02 MED ORDER — ACETAMINOPHEN 500 MG PO TABS
1000.0000 mg | ORAL_TABLET | Freq: Once | ORAL | Status: AC
  Administered 2023-09-02: 1000 mg via ORAL

## 2023-09-02 MED ORDER — PROPOFOL 10 MG/ML IV BOLUS
INTRAVENOUS | Status: AC
  Filled 2023-09-02: qty 20

## 2023-09-02 MED ORDER — ACETAMINOPHEN 500 MG PO TABS: 1000.0000 mg | ORAL_TABLET | ORAL | Status: AC

## 2023-09-02 MED ORDER — OXYCODONE HCL 5 MG PO TABS: 5.0000 mg | ORAL_TABLET | Freq: Four times a day (QID) | ORAL | 0 refills | Status: DC | PRN

## 2023-09-02 MED ORDER — GLYCOPYRROLATE 0.2 MG/ML IJ SOLN
INTRAMUSCULAR | Status: DC | PRN
  Administered 2023-09-02: .1 mg via INTRAVENOUS

## 2023-09-02 SURGICAL SUPPLY — 33 items
BLADE SURG 15 STRL LF DISP TIS (BLADE) ×1 IMPLANT
CANISTER SUC SOCK COL 7IN (MISCELLANEOUS) IMPLANT
CANISTER SUCT 1200ML W/VALVE (MISCELLANEOUS) IMPLANT
CHLORAPREP W/TINT 26 (MISCELLANEOUS) ×1 IMPLANT
CLIP APPLIE 9.375 MED OPEN (MISCELLANEOUS) ×1 IMPLANT
COVER BACK TABLE 60X90IN (DRAPES) ×1 IMPLANT
COVER MAYO STAND STRL (DRAPES) ×1 IMPLANT
COVER PROBE CYLINDRICAL 5X96 (MISCELLANEOUS) ×1 IMPLANT
DERMABOND ADVANCED .7 DNX12 (GAUZE/BANDAGES/DRESSINGS) ×1 IMPLANT
DRAPE LAPAROSCOPIC ABDOMINAL (DRAPES) ×1 IMPLANT
DRAPE UTILITY XL STRL (DRAPES) ×1 IMPLANT
ELECT COATED BLADE 2.86 ST (ELECTRODE) ×1 IMPLANT
ELECTRODE REM PT RTRN 9FT ADLT (ELECTROSURGICAL) ×1 IMPLANT
GLOVE BIO SURGEON STRL SZ7.5 (GLOVE) ×1 IMPLANT
GOWN STRL REUS W/ TWL LRG LVL3 (GOWN DISPOSABLE) ×2 IMPLANT
KIT MARKER MARGIN INK (KITS) ×1 IMPLANT
NDL HYPO 25X1 1.5 SAFETY (NEEDLE) ×1 IMPLANT
NDL SAFETY ECLIPSE 18X1.5 (NEEDLE) IMPLANT
NEEDLE HYPO 25X1 1.5 SAFETY (NEEDLE) ×1 IMPLANT
NS IRRIG 1000ML POUR BTL (IV SOLUTION) IMPLANT
PACK BASIN DAY SURGERY FS (CUSTOM PROCEDURE TRAY) ×1 IMPLANT
PENCIL SMOKE EVACUATOR (MISCELLANEOUS) ×1 IMPLANT
SLEEVE SCD COMPRESS KNEE MED (STOCKING) ×1 IMPLANT
SPIKE FLUID TRANSFER (MISCELLANEOUS) IMPLANT
SPONGE T-LAP 18X18 ~~LOC~~+RFID (SPONGE) ×1 IMPLANT
SUT MON AB 4-0 PC3 18 (SUTURE) ×2 IMPLANT
SUT SILK 2 0 SH (SUTURE) IMPLANT
SUT VICRYL 3-0 CR8 SH (SUTURE) ×1 IMPLANT
SYR CONTROL 10ML LL (SYRINGE) ×1 IMPLANT
TOWEL GREEN STERILE FF (TOWEL DISPOSABLE) ×1 IMPLANT
TRAY FAXITRON CT DISP (TRAY / TRAY PROCEDURE) ×1 IMPLANT
TUBE CONNECTING 20X1/4 (TUBING) IMPLANT
YANKAUER SUCT BULB TIP NO VENT (SUCTIONS) IMPLANT

## 2023-09-02 NOTE — Transfer of Care (Signed)
 Immediate Anesthesia Transfer of Care Note  Patient: Tara Johnson  Procedure(s) Performed: BREAST LUMPECTOMY WITH RADIOACTIVE SEED AND SENTINEL LYMPH NODE BIOPSY (Left: Breast)  Patient Location: PACU  Anesthesia Type:General and Regional  Level of Consciousness: awake and drowsy  Airway & Oxygen Therapy: Patient Spontanous Breathing and Patient connected to face mask  Post-op Assessment: Report given to RN, Patient moving all extremities X 4, and Patient able to stick tongue midline  Post vital signs: Reviewed and stable  Last Vitals:  Vitals Value Taken Time  BP    Temp    Pulse    Resp    SpO2      Last Pain:  Vitals:   09/02/23 0950  TempSrc: Temporal  PainSc: 0-No pain         Complications: No notable events documented.

## 2023-09-02 NOTE — Anesthesia Postprocedure Evaluation (Signed)
 Anesthesia Post Note  Patient: ELLARAE NEVITT  Procedure(s) Performed: BREAST LUMPECTOMY WITH RADIOACTIVE SEED AND SENTINEL LYMPH NODE BIOPSY (Left: Breast)     Patient location during evaluation: PACU Anesthesia Type: Regional Level of consciousness: oriented, awake and alert and patient cooperative Pain management: pain level controlled Vital Signs Assessment: post-procedure vital signs reviewed and stable Respiratory status: spontaneous breathing, nonlabored ventilation and respiratory function stable Cardiovascular status: blood pressure returned to baseline and stable Postop Assessment: no apparent nausea or vomiting Anesthetic complications: no  No notable events documented.  Last Vitals:  Vitals:   09/02/23 1400 09/02/23 1415  BP: (!) 159/88 (!) 157/75  Pulse: 67 60  Resp: 19 17  Temp:    SpO2: 97% 97%    Last Pain:  Vitals:   09/02/23 1424  TempSrc:   PainSc: 6                  Tereso Unangst,E. Zigmund Linse

## 2023-09-02 NOTE — Progress Notes (Signed)
 Nuclear medicine injections completed. Patient tolerated well.

## 2023-09-02 NOTE — Interval H&P Note (Signed)
 History and Physical Interval Note:  09/02/2023 11:32 AM  Tara Johnson  has presented today for surgery, with the diagnosis of LEFT BREAST CANCER.  The various methods of treatment have been discussed with the patient and family. After consideration of risks, benefits and other options for treatment, the patient has consented to  Procedure(s) with comments: BREAST LUMPECTOMY WITH RADIOACTIVE SEED AND SENTINEL LYMPH NODE BIOPSY (Left) - GEN w/PEC BLOCK LEFT BREAST RADIOACTIVE SEED LOCALIZED LUMPECTOMY SENTINEL NODE BIOPSY as a surgical intervention.  The patient's history has been reviewed, patient examined, no change in status, stable for surgery.  I have reviewed the patient's chart and labs.  Questions were answered to the patient's satisfaction.     Lillette Reid III

## 2023-09-02 NOTE — Anesthesia Preprocedure Evaluation (Addendum)
 Anesthesia Evaluation  Patient identified by MRN, date of birth, ID band Patient awake    Reviewed: Allergy & Precautions, NPO status , Patient's Chart, lab work & pertinent test results  History of Anesthesia Complications Negative for: history of anesthetic complications  Airway Mallampati: I  TM Distance: >3 FB Neck ROM: Full    Dental  (+) Poor Dentition, Missing, Chipped, Dental Advisory Given   Pulmonary asthma , COPD,  COPD inhaler   breath sounds clear to auscultation       Cardiovascular hypertension, Pt. on medications (-) angina  Rhythm:Regular Rate:Normal     Neuro/Psych  PSYCHIATRIC DISORDERS ([place])      negative neurological ROS     GI/Hepatic negative GI ROS, Neg liver ROS,,,  Endo/Other  diabetes (glu 125), Oral Hypoglycemic AgentsHypothyroidism  Semaglutide BMI 42  Renal/GU negative Renal ROS     Musculoskeletal  (+) Arthritis ,    Abdominal   Peds  Hematology negative hematology ROS (+)   Anesthesia Other Findings Breast cancer  Reproductive/Obstetrics                             Anesthesia Physical Anesthesia Plan  ASA: 3  Anesthesia Plan: General   Post-op Pain Management: Regional block* and Tylenol  PO (pre-op)*   Induction: Intravenous  PONV Risk Score and Plan: Ondansetron , Dexamethasone  and Scopolamine  patch - Pre-op  Airway Management Planned: LMA  Additional Equipment: None  Intra-op Plan:   Post-operative Plan:   Informed Consent: I have reviewed the patients History and Physical, chart, labs and discussed the procedure including the risks, benefits and alternatives for the proposed anesthesia with the patient or authorized representative who has indicated his/her understanding and acceptance.     Dental advisory given  Plan Discussed with: CRNA and Surgeon  Anesthesia Plan Comments: (Plan routine monitors, GA with pectoralis block for  post op analgesia)       Anesthesia Quick Evaluation

## 2023-09-02 NOTE — Anesthesia Procedure Notes (Signed)
 Anesthesia Regional Block: Pectoralis block   Pre-Anesthetic Checklist: , timeout performed,  Correct Patient, Correct Site, Correct Laterality,  Correct Procedure, Correct Position, site marked,  Risks and benefits discussed,  Surgical consent,  Pre-op evaluation,  At surgeon's request and post-op pain management  Laterality: Left and Upper  Prep: chloraprep       Needles:  Injection technique: Single-shot  Needle Type: Echogenic Needle     Needle Length: 9cm  Needle Gauge: 21     Additional Needles:   Procedures:,,,, ultrasound used (permanent image in chart),,    Narrative:  Start time: 09/02/2023 10:54 AM End time: 09/02/2023 11:00 AM Injection made incrementally with aspirations every 5 mL.  Performed by: Personally  Anesthesiologist: Jonne Netters, MD  Additional Notes: Pt identified in Holding room.  Monitors applied. Working IV access confirmed. Timeout, Sterile prep L clavicle and pec.  #21ga ECHOgenic Arrow block needle between pec minor and intercostal, then pec major and pec minor, between ribs 3,4 with US  guidance.  Total 30cc 0.5% Bupivacaine  1:200k epi injected incrementally after negative test dose.  Patient asymptomatic, VSS, no heme aspirated, tolerated well.   Fay Hoop, MD

## 2023-09-02 NOTE — Discharge Instructions (Addendum)
  Post Anesthesia Home Care Instructions  Activity: Get plenty of rest for the remainder of the day. A responsible individual must stay with you for 24 hours following the procedure.  For the next 24 hours, DO NOT: -Drive a car -Advertising copywriter -Drink alcoholic beverages -Take any medication unless instructed by your physician -Make any legal decisions or sign important papers.  Meals: Start with liquid foods such as gelatin or soup. Progress to regular foods as tolerated. Avoid greasy, spicy, heavy foods. If nausea and/or vomiting occur, drink only clear liquids until the nausea and/or vomiting subsides. Call your physician if vomiting continues.  Special Instructions/Symptoms: Your throat may feel dry or sore from the anesthesia or the breathing tube placed in your throat during surgery. If this causes discomfort, gargle with warm salt water. The discomfort should disappear within 24 hours.  Next dose of tylenol  will be at 4pm  Next dose of ibuprofen  (Advil ,aleve, motrin ) will be at 5pm

## 2023-09-02 NOTE — Progress Notes (Signed)
Assisted Dr. Carswell Jackson with left, pectoralis, ultrasound guided block. Side rails up, monitors on throughout procedure. See vital signs in flow sheet. Tolerated Procedure well. 

## 2023-09-02 NOTE — H&P (Signed)
 REFERRING PHYSICIAN: Sonja Elroy, MD PROVIDER: Arlester Bence, MD MRN: [id number] DOB: 07-29-1960 Subjective    Chief Complaint: Breast Cancer  History of Present Illness: Tara Johnson is a 63 y.o. female who is seen today as an office consultation for evaluation of Breast Cancer  We are asked to see the patient in consultation by Dr. Maryalice Smaller to evaluate her for a new left breast cancer. The patient is a 63 year old white female who recently went for a routine screening mammogram. At that time she was found to have a 1.1 cm mass in the inner aspect of the left breast. The axilla looked normal. 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 She does have some diabetes and hypertension. She does not smoke. She has a family history of breast cancer in a paternal cousin.  Review of Systems: A complete review of systems was obtained from the patient. I have reviewed this information and discussed as appropriate with the patient. See HPI as well for other ROS.  ROS   Medical History: Past Medical History:  Diagnosis Date  Arthritis  Asthma, unspecified asthma severity, unspecified whether complicated, unspecified whether persistent (HHS-HCC)  Diabetes mellitus without complication (CMS/HHS-HCC)  History of cancer  Hyperlipidemia  Hypertension  Thyroid  disease   Patient Active Problem List  Diagnosis  Essential hypertension  Hypothyroidism  Malignant neoplasm of upper-inner quadrant of left breast in female, estrogen receptor positive (CMS/HHS-HCC)  Type 2 diabetes mellitus with other specified complication (CMS/HHS-HCC)   Past Surgical History:  Procedure Laterality Date  Dilatation and curettage N/A 10/12/2019  Hysteroscopy, polypectomy with myosure  .Left Breast Biopsy Left 08/07/2023  CHOLECYSTECTOMY    Allergies  Allergen Reactions  Metformin Diarrhea, Nausea and Vomiting   Current  Outpatient Medications on File Prior to Visit  Medication Sig Dispense Refill  albuterol MDI, PROVENTIL, VENTOLIN, PROAIR, HFA 90 mcg/actuation inhaler Inhale 1 Puff into the lungs every 6 (six) hours as needed for Shortness of Breath  amLODIPine (NORVASC) 5 MG tablet Take 5 mg by mouth once daily  dulaglutide (TRULICITY) 1.5 mg/0.5 mL subcutaneous pen injector Inject 1.5 mg subcutaneously once a week  ergocalciferol, vitamin D2, 1,250 mcg (50,000 unit) capsule Take 50,000 Units by mouth every 7 (seven) days  glimepiride (AMARYL) 4 MG tablet Take 4 mg by mouth daily with breakfast  levothyroxine (SYNTHROID) 112 MCG tablet Take 112 mcg by mouth once daily  lisinopriL (ZESTRIL) 20 MG tablet Take 20 mg by mouth once daily  methocarbamoL (ROBAXIN) 500 MG tablet Take 500 mg by mouth 4 (four) times daily as needed (as needed for muscle spasms)  mometasone (ASMANEX TWISTHALER) 220 mcg/ actuation (60) inhaler Inhale 2 inhalations into the lungs once daily  rosuvastatin (CRESTOR) 10 MG tablet Take 10 mg by mouth once daily   No current facility-administered medications on file prior to visit.   Family History  Problem Relation Age of Onset  Skin cancer Brother  Diabetes Brother    Social History   Tobacco Use  Smoking Status Never  Smokeless Tobacco Never    Social History   Socioeconomic History  Marital status: Married  Tobacco Use  Smoking status: Never  Smokeless tobacco: Never  Vaping Use  Vaping status: Never Used  Substance and Sexual Activity  Alcohol use: Not Currently  Drug use: Never   Social Drivers of Health   Housing Stability: Unknown (08/18/2023)  Housing  Stability Vital Sign  Homeless in the Last Year: No   Objective:  There were no vitals filed for this visit.  There is no height or weight on file to calculate BMI.  Physical Exam Vitals reviewed.  Constitutional:  General: She is not in acute distress. Appearance: Normal appearance.  HENT:  Head:  Normocephalic and atraumatic.  Right Ear: External ear normal.  Left Ear: External ear normal.  Nose: Nose normal.  Mouth/Throat:  Mouth: Mucous membranes are moist.  Pharynx: Oropharynx is clear.  Eyes:  General: No scleral icterus. Extraocular Movements: Extraocular movements intact.  Conjunctiva/sclera: Conjunctivae normal.  Pupils: Pupils are equal, round, and reactive to light.  Cardiovascular:  Rate and Rhythm: Normal rate and regular rhythm.  Pulses: Normal pulses.  Heart sounds: Normal heart sounds.  Pulmonary:  Effort: Pulmonary effort is normal. No respiratory distress.  Breath sounds: Normal breath sounds.  Abdominal:  General: Bowel sounds are normal.  Palpations: Abdomen is soft.  Tenderness: There is no abdominal tenderness.  Musculoskeletal:  General: No swelling, tenderness or deformity. Normal range of motion.  Cervical back: Normal range of motion and neck supple.  Skin: General: Skin is warm and dry.  Coloration: Skin is not jaundiced.  Neurological:  General: No focal deficit present.  Mental Status: She is alert and oriented to person, place, and time.  Psychiatric:  Mood and Affect: Mood normal.  Behavior: Behavior normal.     Breast: There is no palpable mass in either breast. There is no palpable axillary, supraclavicular, or cervical lymphadenopathy.  Labs, Imaging and Diagnostic Testing:  Assessment and Plan:   Diagnoses and all orders for this visit:  Malignant neoplasm of upper-inner quadrant of left breast in female, estrogen receptor positive (CMS/HHS-HCC) - [facility] Case Posting Request; Future   The patient appears to have a 1.1 cm cancer in the inner aspect of the left breast with clinically negative nodes and all favorable markers. I have discussed with her in detail the different options for treatment and at this point she favors breast conservation which I feel is very reasonable. She would be a candidate for sentinel node biopsy. I  have discussed with her in detail the risks and benefits of the operation as well as some of the technical aspects including the use of a radioactive seed for localization and she understands and wishes to proceed. We will move forward with surgical scheduling. She will also meet with medical and radiation oncology to discuss adjuvant therapy.

## 2023-09-02 NOTE — Op Note (Signed)
 09/02/2023  1:15 PM  PATIENT:  Tara Johnson  63 y.o. female  PRE-OPERATIVE DIAGNOSIS:  LEFT BREAST CANCER  POST-OPERATIVE DIAGNOSIS:  LEFT BREAST CANCER  PROCEDURE:  Procedure(s) with comments: LEFT BREAST RADIOACTIVE SEED LOCALIZED LUMPECTOMYAND DEEP LEFT AXILLARY SENTINEL LYMPH NODE BIOPSY  SURGEON:  Surgeons and Role:    * Caralyn Chandler, MD - Primary  PHYSICIAN ASSISTANT:   ASSISTANTS: none   ANESTHESIA:   local and general  EBL:  25 mL   BLOOD ADMINISTERED:none  DRAINS: none   LOCAL MEDICATIONS USED:  MARCAINE      SPECIMEN:  Source of Specimen:  Left breast tissue and sentinel nodes x 3  DISPOSITION OF SPECIMEN:  PATHOLOGY  COUNTS:  YES  TOURNIQUET:  * No tourniquets in log *  DICTATION: .Dragon Dictation  After informed consent was obtained the patient was brought to the operating room placed in the supine position on the operating table.  After adequate induction of general anesthesia the patient's left chest, breast, and axillary area were prepped with ChloraPrep,, and draped with sterile manner.  An appropriate timeout was performed.  Previously an I-125 seed was placed in the inner aspect of the left breast to mark an area of invasive breast cancer.  Also earlier in the day the patient underwent injection of 1 mCi of technetium sulfur colloid in the subareolar position on the left.  The neoprobe was initially set to technetium and an area of radioactivity was readily identified in the left axilla.  The area overlying this was infiltrated with quarter percent Marcaine .  A small transversely oriented incision was made with a 15 blade knife overlying this area directly.  The incision was carried through the skin and subcutaneous tissue sharply with the electrocautery until the deep left axillary space was entered.  The neoprobe was used to direct blunt hemostat dissection.  I was able to identify 3 lymph nodes with signal.  Each of these was excised sharply with these  electrocautery and the surrounding small vessels and lymphatics were controlled with clips.  Ex vivo counts on these nodes ranged from [phone].  No other hot or palpable nodes were identified in the left axilla.  Hemostasis was achieved using the Bovie electrocautery.  The deep layer of the axilla was closed with interrupted 3-0 Vicryl stitches.  The skin was then closed with a running 4-0 Monocryl subcuticular stitch.  Attention was then turned to the left breast.  The neoprobe was sent to I-125 in the area of radioactivity was readily identified.  The area around this was infiltrated with quarter percent Marcaine .  A curvilinear incision was then made along the inner aspect of the areola of the left breast with a 15 blade knife.  The incision was carried through the skin and subcutaneous tissue sharply with the electrocautery.  Dissection was then carried throughout the inner aspect of the left breast between the breast tissue and the subcutaneous fat and skin.  Once this dissection was beyond the area of the cancer I then removed a circular portion of breast tissue sharply with the electrocautery around the radioactive seed while checking the area of radioactivity frequently.  Once the tissue was removed it was oriented with the appropriate pain colors.  A specimen radiograph was obtained that showed the clip and seed to be near the center of the specimen.  The specimen was then sent to pathology for further evaluation.  Hemostasis was achieved using the Bovie electrocautery.  The wound was irrigated with  saline and infiltrated with quarter percent Marcaine .  The cavity was marked with clips.  The deep layer of the incision was closed with layers of interrupted 3-0 Vicryl stitches.  The skin was closed with interrupted 4-0 Monocryl subcuticular stitches.  Dermabond dressings were applied.  The patient tolerated the procedure well.  At the end of the case all needle sponge and instrument counts were correct.  The  patient was brought awakened and taken to recovery in stable condition.  PLAN OF CARE: Discharge to home after PACU  PATIENT DISPOSITION:  PACU - hemodynamically stable.   Delay start of Pharmacological VTE agent (>24hrs) due to surgical blood loss or risk of bleeding: not applicable

## 2023-09-02 NOTE — Anesthesia Procedure Notes (Signed)
 Procedure Name: LMA Insertion Date/Time: 09/02/2023 12:06 PM  Performed by: Gladystine Lamprey, CRNAPre-anesthesia Checklist: Patient identified, Emergency Drugs available, Suction available and Patient being monitored Patient Re-evaluated:Patient Re-evaluated prior to induction Oxygen Delivery Method: Circle system utilized Preoxygenation: Pre-oxygenation with 100% oxygen Induction Type: IV induction Ventilation: Mask ventilation without difficulty LMA: LMA inserted LMA Size: 4.0 Number of attempts: 1 Placement Confirmation: positive ETCO2 and breath sounds checked- equal and bilateral Tube secured with: Tape Dental Injury: Teeth and Oropharynx as per pre-operative assessment

## 2023-09-03 ENCOUNTER — Encounter (HOSPITAL_BASED_OUTPATIENT_CLINIC_OR_DEPARTMENT_OTHER): Payer: Self-pay | Admitting: General Surgery

## 2023-09-03 LAB — SURGICAL PATHOLOGY

## 2023-09-10 ENCOUNTER — Encounter: Payer: Self-pay | Admitting: *Deleted

## 2023-09-10 NOTE — Progress Notes (Signed)
 Sent oncotype out and faxed to [facility] pathology

## 2023-09-14 ENCOUNTER — Ambulatory Visit: Payer: Self-pay | Admitting: General Surgery

## 2023-09-15 ENCOUNTER — Encounter (HOSPITAL_COMMUNITY): Payer: Self-pay

## 2023-09-24 ENCOUNTER — Encounter: Payer: Self-pay | Admitting: *Deleted

## 2023-09-29 ENCOUNTER — Encounter: Payer: Self-pay | Admitting: *Deleted

## 2023-09-29 ENCOUNTER — Telehealth: Payer: Self-pay | Admitting: *Deleted

## 2023-09-29 DIAGNOSIS — Z170 Estrogen receptor positive status [ER+]: Secondary | ICD-10-CM

## 2023-09-29 NOTE — Therapy (Signed)
 OUTPATIENT PHYSICAL THERAPY BREAST CANCER POST OP FOLLOW UP   Patient Name: Tara Johnson MRN: [id number] DOB:Apr 22, 1960, 63 y.o., female Today's Date: 09/30/2023  END OF SESSION:   Past Medical History:  Diagnosis Date   Arthritis    lower back and bilateral knees   Asperger's syndrome    High functioning   Asthma    associated with upper respiratory infection use inhaler as needed   Autism    high functioning   Breast cancer (HCC) 07/2023   left breast IDC   Cancer (HCC)    precancerous lesions on skin 20 plus   Chronic back pain    Closed fracture of right distal radius    Complication of anesthesia    prolonged sedation   Diabetes mellitus without complication (HCC)    Diabetic neuropathy (HCC)    Family history of breast cancer    Family history of ovarian cancer    Hip pain    Hypertension    Hypothyroidism    Osteopenia    Thyroid  disease    Vitamin D deficiency    Past Surgical History:  Procedure Laterality Date   BREAST BIOPSY     years ago unsure of side   BREAST BIOPSY Left 08/07/2023   US  LT BREAST BX W LOC DEV 1ST LESION IMG BX SPEC US  GUIDE 08/07/2023 [facility]   BREAST BIOPSY Left 09/01/2023   US  LT RADIOACTIVE SEED LOC 09/01/2023 [facility]   BREAST LUMPECTOMY WITH RADIOACTIVE SEED AND SENTINEL LYMPH NODE BIOPSY Left 09/02/2023   Procedure: BREAST LUMPECTOMY WITH RADIOACTIVE SEED AND SENTINEL LYMPH NODE BIOPSY;  Surgeon: Curvin Deward MOULD, MD;  Location: [facility];  Service: General;  Laterality: Left;  GEN w/PEC BLOCK LEFT BREAST RADIOACTIVE SEED LOCALIZED LUMPECTOMY SENTINEL NODE BIOPSY   CHOLECYSTECTOMY     DILATATION & CURETTAGE/HYSTEROSCOPY WITH MYOSURE N/A 09/26/2015   Procedure: DILATATION & CURETTAGE/HYSTEROSCOPY WITH MYOSURE;  Surgeon: Rexene Hoit, MD;  Location: [facility];  Service: Gynecology;  Laterality: N/A;  Polypectomy   DILATATION & CURETTAGE/HYSTEROSCOPY WITH MYOSURE N/A 10/12/2019   Procedure: DILATATION &  CURETTAGE/HYSTEROSCOPY, POLYPECTOMY WITH MYOSURE;  Surgeon: Hoit Rexene, MD;  Location: [facility];  Service: Gynecology;  Laterality: N/A;   OPEN REDUCTION INTERNAL FIXATION (ORIF) DISTAL RADIAL FRACTURE Right 04/19/2018   Procedure: OPEN TREATMENT OF RIGHT DISTAL RADIUS;  Surgeon: Sebastian Lenis, MD;  Location: [facility];  Service: Orthopedics;  Laterality: Right;   Patient Active Problem List   Diagnosis Date Noted   Genetic testing 08/31/2023   Family history of ovarian cancer 08/19/2023   Family history of breast cancer    Malignant neoplasm of upper-inner quadrant of left breast in female, estrogen receptor positive (HCC) 08/17/2023   Postmenopausal bleeding 09/26/2015   Endometrial polyp 09/26/2015    REFERRING PROVIDER: Dr. Deward Curvin  REFERRING DIAG: Left breast cancer  THERAPY DIAG:  Malignant neoplasm of upper-inner quadrant of left breast in female, estrogen receptor positive (HCC)  Abnormal posture  At risk for lymphedema  Aftercare following surgery for neoplasm  Rationale for Evaluation and Treatment: Rehabilitation  ONSET DATE: 07/20/23  SUBJECTIVE:  SUBJECTIVE STATEMENT: The incision in the armpit is the worst.  It is burning and painful.  Yesterday I started to get some pain in the shoulder and neck.  Surgeon said everything look fine, nothing to drain.  I can't lay on that side.  I got compression bras but they were too small after surgery.  No problems with the breast itself.    PERTINENT HISTORY:  Post left lumpectomy with SLNB on 09/02/23 with 6 negative lymph nodes removed. Will have radiation.  Patient was diagnosed on 07/20/2023 with left grade 2 invasive ductal carcinoma breast cancer. It measures 1.1 cm and is located in the upper inner quadrant.  It is ER/PR positive and HER2 negative with a Ki67 of 1%. She reports that she has autism, diabetes and is on Ozempic for weight loss.   PATIENT GOALS:  Reassess how my recovery is going related to arm function, pain, and swelling.  PAIN:  Are you having pain? Yes: NPRS scale: 2-3/10, 1-2 per day it goes up to 7/10 Pain location: right above the incision  Pain description: alternates between burning and a deep bruise Aggravating factors: it is worse in the morning and the end of the day.   Relieving factors: ibuprofen , and gabapentin   PRECAUTIONS: Recent Surgery, left UE Lymphedema risk  RED FLAGS: None   ACTIVITY LEVEL / LEISURE: I can't hold a jar like I usually do.     OBJECTIVE:   PATIENT SURVEYS:  QUICK DASH: 38% from 6%   OBSERVATIONS: Wearing a regular bra    POSTURE:  Rounded shoulders,  sitting with left shoulder elevated    LYMPHEDEMA ASSESSMENT:   UPPER EXTREMITY AROM/PROM:   A/PROM RIGHT   eval    Shoulder extension 60  Shoulder flexion 137  Shoulder abduction 152  Shoulder internal rotation 69  Shoulder external rotation 71                          (Blank rows = not tested)   A/PROM LEFT   eval 09/30/23  Shoulder extension 60 60  Shoulder flexion 118 110 increased burning and pain  / 125 with fungal infection noted in axilla - took photo and sent note to [name], NP  Shoulder abduction 152 98 with increased pain / 120  Shoulder internal rotation 50 50  Shoulder external rotation 58 80                          (Blank rows = not tested)   CERVICAL AROM: All within normal limits   UPPER EXTREMITY STRENGTH: WFL   LYMPHEDEMA ASSESSMENTS (in cm):    LANDMARK RIGHT   eval  10 cm proximal to olecranon process 37  Olecranon process 26.5  10 cm proximal to ulnar styloid process 22.4  Just proximal to ulnar styloid process 16  Across hand at thumb web space 18.3  At base of 2nd digit 6.3  (Blank rows = not tested)   LANDMARK LEFT   eval   10 cm proximal to olecranon process 37.9  Olecranon process 27.7  10 cm proximal to ulnar styloid process 22.9  Just proximal to ulnar styloid process 16.3  Across hand at thumb web space 18.5  At base of 2nd digit 6.3  (Blank rows = not tested)  TODAYS TREATMENT 09/30/23 Re-eval performed Sent note and educated pt about fungal infection and irritation in the axilla which may be causing some  of the burning feeling.  Educated pt on updated HEP per below with performance of each.  Discussed POC.   PATIENT EDUCATION:  Education details: per today's note Person educated: Patient Education method: Explanation, Demonstration, Tactile cues, Verbal cues, and Handouts Education comprehension: verbalized understanding and returned demonstration  HOME EXERCISE PROGRAM: Reviewed previously given post op HEP. Access Code: [email or web address] URL: [email or web address] Date: 09/30/2023 Prepared by: Saddie Raw  Exercises - Supine Shoulder Flexion Extension AAROM with Dowel  - 1-2 x daily - 7 x weekly - 10 reps - 5 seconds hold - Supine Chest Stretch with Elbows Bent  - 1-2 x daily - 7 x weekly - 1 sets - 10 reps - 5 sec hold - Seated Upper Trapezius Stretch  - 2 x daily - 7 x weekly - 3 reps - 30 seconds hold - Seated Scapular Retraction  - 1-3 x daily - 7 x weekly - 1 sets - 10 reps - 2-3 seconds hold - Standing Shoulder Abduction Finger Walk at Wall  - 1-2 x daily - 7 x weekly - 1 sets - 5 reps - 3 sec hold  ASSESSMENT:  CLINICAL IMPRESSION: Pt presents 4 weeks post lumpectomy with the most trouble with burning and irritation in the axilla leading to decrease AROM.  It was noted that she had a funal infection and irritation in the axilla which may be contributing to a lot of the irritated feeling.  She reports an issue with hard to treat fungal infections in the past so a note was sent to nurse for instructions.  Pt was also not performing her exercises so we reviewed these.  We will  add more visits to get ready for radiation and improve AROM.    Pt will benefit from skilled therapeutic intervention to improve on the following deficits: Decreased knowledge of precautions, impaired UE functional use, pain, decreased ROM, postural dysfunction.   PT treatment/interventions: ADL/Self care home management, [phone]- PT Re-evaluation, 97110-Therapeutic exercises, 97530- Therapeutic activity, [id number]- Neuromuscular re-education, 97535- Self Care, and [id number]- Manual therapy   GOALS: Goals reviewed with patient? Yes  LONG TERM GOALS:  (STG=LTG)  GOALS Name Target Date  Goal status  1 Pt will demonstrate she has regained full shoulder ROM and function post operatively compared to baselines.  Baseline: 10/28/23 INITIAL  2 Pt will be ind with final HEP for radiation and after 10/28/23 INITIAL  3 Pt will be educated on personal lymphedema risk and surveillance principles  10/28/23 INITIAL  4 Pt will decrease QDASH to 10% or less 10/28/23 INITIAL     PLAN:  PT FREQUENCY/DURATION: 1-2x per week x 3 weeks  PLAN FOR NEXT SESSION: Lt shoulder PROM/AAROM, neck stretches, monitor fungal infection   [facility]  [address], [address]  [place] [address]  [phone]  After Breast Cancer Class Video It is recommended you view the ABC class video to be educated on lymphedema risk reduction. This video lasts for about 30 minutes. It can be viewed on our website here: [email or web address]  Scar massage You can begin gentle scar massage to you incision sites. Gently place one hand on the incision and move the skin (without sliding on the skin) in various directions. Do this for a few minutes and then you can gently massage either coconut oil or vitamin E cream into the scars.  Compression garment You should continue wearing your compression bra until you feel like you no longer have  swelling.  Home exercise Program Continue doing  the exercises you were given until you feel like you can do them without feeling any tightness at the end.   Walking Program Studies show that 30 minutes of walking per day (fast enough to elevate your heart rate) can significantly reduce the risk of a cancer recurrence. If you can't walk due to other medical reasons, we encourage you to find another activity you could do (like a stationary bike or water exercise).  Posture After breast cancer surgery, people frequently sit with rounded shoulders posture because it puts their incisions on slack and feels better. If you sit like this and scar tissue forms in that position, you can become very tight and have pain sitting or standing with good posture. Try to be aware of your posture and sit and stand up tall to heal properly.  Follow up PT: It is recommended you return every 3 months for the first 3 years following surgery to be assessed on the SOZO machine for an L-Dex score. This helps prevent clinically significant lymphedema in 95% of patients. These follow up screens are 10 minute appointments that you are not billed for.  Larue Saddie SAUNDERS, PT 09/30/2023, 9:05 AM

## 2023-09-29 NOTE — Telephone Encounter (Signed)
 Received oncotype of 9/3%.  Patient aware.  Referral placed for Dr. Dewey.

## 2023-09-30 ENCOUNTER — Other Ambulatory Visit: Payer: Self-pay | Admitting: Hematology

## 2023-09-30 ENCOUNTER — Ambulatory Visit: Attending: General Surgery | Admitting: Rehabilitation

## 2023-09-30 ENCOUNTER — Encounter: Payer: Self-pay | Admitting: Rehabilitation

## 2023-09-30 DIAGNOSIS — C50212 Malignant neoplasm of upper-inner quadrant of left female breast: Secondary | ICD-10-CM | POA: Insufficient documentation

## 2023-09-30 DIAGNOSIS — Z483 Aftercare following surgery for neoplasm: Secondary | ICD-10-CM | POA: Diagnosis present

## 2023-09-30 DIAGNOSIS — Z170 Estrogen receptor positive status [ER+]: Secondary | ICD-10-CM | POA: Diagnosis present

## 2023-09-30 DIAGNOSIS — Z9189 Other specified personal risk factors, not elsewhere classified: Secondary | ICD-10-CM | POA: Insufficient documentation

## 2023-09-30 DIAGNOSIS — R293 Abnormal posture: Secondary | ICD-10-CM | POA: Diagnosis present

## 2023-09-30 MED ORDER — NYSTATIN 100000 UNIT/GM EX POWD: 1.0000 | Freq: Two times a day (BID) | CUTANEOUS | 2 refills | Status: AC

## 2023-10-01 ENCOUNTER — Telehealth: Payer: Self-pay | Admitting: Radiation Oncology

## 2023-10-01 ENCOUNTER — Telehealth: Payer: Self-pay | Admitting: *Deleted

## 2023-10-01 NOTE — Progress Notes (Signed)
 Location of Breast Cancer: Left breast  Histology per Pathology Report: 09/02/2023 FINAL MICROSCOPIC DIAGNOSIS: A. LYMPH NODE, LEFT AXILLARY #1, SENTINEL, EXCISION: - One lymph node negative for malignancy (0/1). - See cancer summary below. B. LYMPH NODE, LEFT AXILLARY, SENTINEL, EXCISION: - One lymph node negative for malignancy (0/1). - See cancer summary below. C. LYMPH NODE, LEFT AXILLARY, SENTINEL, EXCISION: - One lymph node negative for malignancy (0/1). - See cancer summary below. D. LYMPH NODE, LEFT AXILLARY #2, SENTINEL, EXCISION: - One lymph node negative for malignancy (0/1). - See cancer summary below. E. LYMPH NODE, LEFT AXILLARY, SENTINEL, EXCISION: - One lymph node negative for malignancy (0/1). - See cancer summary below. F. LYMPH NODE, LEFT AXILLARY #3, SENTINEL, EXCISION: - One lymph node negative for malignancy (0/1). - See cancer summary below. G. BREAST, LEFT, LUMPECTOMY: - Invasive carcinoma of no special type (ductal). - Ductal carcinoma in situ (DCIS). - See cancer summary below. - Biopsy site with heart clip. - Radioactive seed.  Did patient present with symptoms (if so, please note symptoms) or was this found on screening mammography?: The breast cancer was discovered on a screening mammogram, with no prior palpable lump or abnormality. It is located in the left breast, measuring 1.1 cm, axillary lymph nodes were negative on ultrasound. She underwent a biopsy of the breast mass, which showed invasive ductal carcinoma.  ER 100% positive, PR 100% positive, and HER2 negative, with Ki-67 1%.   Past/Anticipated interventions by surgeon, if any:09/02/2023 Curvin Deward MOULD, MD   Procedure Laterality Anesthesia  BREAST LUMPECTOMY WITH RADIOACTIVE SEED AND SENTINEL LYMPH NODE BIOPSY Left General  GEN w/PEC BLOCK LEFT BREAST RADIOACTIVE SEED LOCALIZED LUMPECTOMY SENTINEL NODE BIOPSY      Past/Anticipated interventions by medical oncology, if any: Dr. 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 Tumor is 1.1 cm, located at 9 o'clock position, 3 cm from the nipple. ER and PR positive, HER2 negative, grade 2. Favorable prognosis with negative lymph nodes on ultrasound. Anticipated low risk of recurrence.  -Oncotype test recommended to assess recurrence risk.  -Discussed treatment options including surgery, radiation, and anti-estrogen therapy. Explained that anti-estrogen therapy reduces risk of recurrence by eliminating estrogen and progesterone, which the tumor cells express receptors for. Discussed potential side effects of anti-estrogen therapy, including hot flashes, mood swings, joint stiffness, and impact on bone density. Radiation therapy recommended to reduce local recurrence risk. Chemotherapy unlikely needed unless oncotype score indicates high risk. - Order oncotype test to assess recurrence risk and the need for adjuvant chemotherapy. - Plan for anti-estrogen therapy with anastrozole after completion of radiation therapy. - Schedule follow-up appointment for the last week of radiation therapy to discuss anti-estrogen therapy in detail.  Chemotherapy- None currently  Lymphedema issues, if any:  yes left w/ cording.  Pain issues, if any: Lymph node incision tenderness 3/10 w/ site thickness upon palpation.  Skin issues if any: None  SAFETY ISSUES: Prior radiation? No Pacemaker/ICD? No Possible current pregnancy? No- Postmenopausal Is the patient on methotrexate? No  Current Complaints / other details:   Vitals: BP (!) 155/104 (BP Location: Left Arm, Patient Position: Sitting, Cuff Size: Large)   Pulse 81   Temp 97.8 F (36.6 C) (Temporal)   Resp 20   Ht 5' 4 (1.626 m)   Wt 247 lb 6.4 oz (112.2 kg)  SpO2 98%   BMI 42.47  kg/m   This concludes the interaction.  Tara Minerva, LPN

## 2023-10-01 NOTE — Telephone Encounter (Signed)
 Spoke with patient to let her know Dr. Lanny sent a prescription for nystatin  powder for under her arm.  Patient appreciative.

## 2023-10-01 NOTE — Telephone Encounter (Signed)
 Sent Deep Inspiration Breath Hold document via MyChart.

## 2023-10-02 ENCOUNTER — Encounter (HOSPITAL_COMMUNITY): Payer: Self-pay

## 2023-10-05 NOTE — Progress Notes (Incomplete)
 Radiation Oncology         [phone] ________________________________  Name: Tara Johnson        MRN: [id number]  Date of Service: 10/07/2023 DOB: [birth year]  RR:[id number], Norleen, MD (Inactive)  Lanny Callander, MD     REFERRING PHYSICIAN: Lanny Callander, MD   DIAGNOSIS: The encounter diagnosis was Malignant neoplasm of upper-inner quadrant of left breast in female, estrogen receptor positive (HCC).   HISTORY OF PRESENT ILLNESS: Tara Johnson is a 63 y.o. female originally seen in the multidisciplinary breast clinic for a new diagnosis of left breast cancer. The patient was noted to have asymmetry on screening mammogram. She proceeded with diagnostic mammogram and ultrasound on 08/07/2023 that showed a 1.1 cm mass in the 9:00 position.  No abnormal lymph nodes were appreciated. Accordingly, patient underwent a biopsy on 08/07/2023 that revealed grade 2  invasive ductal carcinoma that was ER and PR positive and HER2 negative with a Ki-67 1%.   Since her last visit, the patient underwent left breast lumpectomy with sentinel lymph node biopsy on 09/02/2023.  This showed a grade 2 invasive ductal carcinoma measuring 14 mm, intermediate grade DCIS was also seen within the specimen.  Her margins were all negative for invasive disease the closest being 5 mm to the anterior margin and 7 mm for DCIS to the medial margin, 6 sentinel lymph nodes were sampled and all were negative for any disease.  Oncotype DX score was performed, and showed a recurrence score of 9 and confirmed her tumor is ER positive PR was  positive and HER2 was negative.  Based on this, Dr. Lanny does not recommend any chemotherapy and she is seen to consider adjuvant radiation.  PREVIOUS RADIATION THERAPY: No   PAST MEDICAL HISTORY:  Past Medical History:  Diagnosis Date   Arthritis    lower back and bilateral knees   Asperger's syndrome    High functioning   Asthma    associated with upper respiratory infection use inhaler as needed    Autism    high functioning   Breast cancer (HCC) 07/2023   left breast IDC   Cancer (HCC)    precancerous lesions on skin 20 plus   Chronic back pain    Closed fracture of right distal radius    Complication of anesthesia    prolonged sedation   Diabetes mellitus without complication (HCC)    Diabetic neuropathy (HCC)    Family history of breast cancer    Family history of ovarian cancer    Hip pain    Hypertension    Hypothyroidism    Osteopenia    Thyroid  disease    Vitamin D deficiency        PAST SURGICAL HISTORY: Past Surgical History:  Procedure Laterality Date   BREAST BIOPSY     years ago unsure of side   BREAST BIOPSY Left 08/07/2023   US  LT BREAST BX W LOC DEV 1ST LESION IMG BX SPEC US  GUIDE 08/07/2023 [facility]   BREAST BIOPSY Left 09/01/2023   US  LT RADIOACTIVE SEED LOC 09/01/2023 [facility]   BREAST LUMPECTOMY WITH RADIOACTIVE SEED AND SENTINEL LYMPH NODE BIOPSY Left 09/02/2023   Procedure: BREAST LUMPECTOMY WITH RADIOACTIVE SEED AND SENTINEL LYMPH NODE BIOPSY;  Surgeon: Curvin Deward MOULD, MD;  Location: [facility];  Service: General;  Laterality: Left;  GEN w/PEC BLOCK LEFT BREAST RADIOACTIVE SEED LOCALIZED LUMPECTOMY SENTINEL NODE BIOPSY   CHOLECYSTECTOMY     DILATATION & CURETTAGE/HYSTEROSCOPY WITH  MYOSURE N/A 09/26/2015   Procedure: DILATATION & CURETTAGE/HYSTEROSCOPY WITH MYOSURE;  Surgeon: Rexene Hoit, MD;  Location: [facility];  Service: Gynecology;  Laterality: N/A;  Polypectomy   DILATATION & CURETTAGE/HYSTEROSCOPY WITH MYOSURE N/A 10/12/2019   Procedure: DILATATION & CURETTAGE/HYSTEROSCOPY, POLYPECTOMY WITH MYOSURE;  Surgeon: Hoit Rexene, MD;  Location: [facility];  Service: Gynecology;  Laterality: N/A;   OPEN REDUCTION INTERNAL FIXATION (ORIF) DISTAL RADIAL FRACTURE Right 04/19/2018   Procedure: OPEN TREATMENT OF RIGHT DISTAL RADIUS;  Surgeon: Sebastian Lenis, MD;  Location: [facility];  Service:  Orthopedics;  Laterality: Right;     FAMILY HISTORY:  Family History  Problem Relation Age of Onset   Ovarian cancer Mother 47 - 34   Brain cancer Mother    Ovarian cancer Maternal Aunt 49 - 69   Ovarian cancer Maternal Aunt 76 - 69   Brain cancer Paternal Aunt    Cancer Paternal Aunt        unknown   Ovarian cancer Maternal Grandmother        early 68s   Liver cancer Paternal Grandmother    Breast cancer Cousin 39 - 59   Liver cancer Cousin    Brain cancer Cousin    Bone cancer Cousin    Cancer Cousin        thymoma     SOCIAL HISTORY:  reports that she has never smoked. She has never used smokeless tobacco. She reports current alcohol use. She reports that she does not use drugs. The patient is married and lives in [place]. 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 ***   ALLERGIES: Metformin and related   MEDICATIONS:  Current Outpatient Medications  Medication Sig Dispense Refill   albuterol (PROVENTIL HFA;VENTOLIN HFA) 108 (90 Base) MCG/ACT inhaler Inhale into the lungs every 6 (six) hours as needed for wheezing or shortness of breath.     amLODipine (NORVASC) 5 MG tablet Take 5 mg by mouth daily.     Fluticasone Furoate (ARNUITY ELLIPTA) 100 MCG/ACT AEPB Inhale 1 puff into the lungs daily.     glimepiride (AMARYL) 4 MG tablet Take 4 mg by mouth daily with breakfast.     glucose blood test strip 1 each by Other route as needed for other. Use as instructed     levothyroxine (SYNTHROID) 112 MCG tablet Take 112 mcg by mouth daily.     lisinopril (PRINIVIL,ZESTRIL) 20 MG tablet Take 20 mg by mouth daily.     methocarbamol (ROBAXIN) 500 MG tablet Take 500 mg by mouth every 6 (six) hours as needed for muscle spasms.     nystatin  (MYCOSTATIN /NYSTOP ) powder Apply 1 Application topically 2 (two) times daily. To axillary skin rashes until rashes clear 60 g 2   oxyCODONE  (ROXICODONE ) 5 MG immediate release tablet Take 1 tablet (5 mg total) by mouth every 6 (six) hours  as needed. 15 tablet 0   rosuvastatin (CRESTOR) 10 MG tablet Take 10 mg by mouth daily.     Semaglutide (OZEMPIC, 2 MG/DOSE, [place]) Inject 2 mg into the skin once a week.     Vitamin D, Ergocalciferol, (DRISDOL) 1.25 MG (50000 UNIT) CAPS capsule Take 50,000 Units by mouth every 7 (seven) days.     No current facility-administered medications for this visit.     REVIEW OF SYSTEMS: On review of systems, the patient reports that she is doing ***     PHYSICAL EXAM:  Wt Readings from Last 3 Encounters:  09/02/23 244 lb  0.8 oz (110.7 kg)  08/19/23 243 lb (110.2 kg)  10/12/19 (!) 286 lb 2.5 oz (129.8 kg)   Temp Readings from Last 3 Encounters:  09/02/23 (!) 97.3 F (36.3 C) (Temporal)  08/19/23 98.4 F (36.9 C) (Temporal)  10/12/19 97.6 F (36.4 C)   BP Readings from Last 3 Encounters:  09/02/23 (!) 141/81  08/19/23 120/78  10/12/19 (!) 153/93   Pulse Readings from Last 3 Encounters:  09/02/23 (!) 55  08/19/23 82  10/12/19 70    In general this is a well appearing  female in no acute distress. She's alert and oriented x4 and appropriate throughout the examination. Cardiopulmonary assessment is negative for acute distress and she exhibits normal effort.  Her left breast incision is intact without erythema, separation or drainage, similar findings are noted in her axilla.    ECOG = ***  0 - Asymptomatic (Fully active, able to carry on all predisease activities without restriction)  1 - Symptomatic but completely ambulatory (Restricted in physically strenuous activity but ambulatory and able to carry out work of a light or sedentary nature. For example, light housework, office work)  2 - Symptomatic, <50% in bed during the day (Ambulatory and capable of all self care but unable to carry out any work activities. Up and about more than 50% of waking hours)  3 - Symptomatic, >50% in bed, but not bedbound (Capable of only limited self-care, confined to bed or chair 50% or more of  waking hours)  4 - Bedbound (Completely disabled. Cannot carry on any self-care. Totally confined to bed or chair)  5 - Death   Raylene MM, Creech RH, Tormey DC, et al. [phone]). Toxicity and response criteria of the [facility]. Am. DOROTHA Bridges. Oncol. 5 (6): 649-55    LABORATORY DATA:  Lab Results  Component Value Date   WBC 6.3 08/19/2023   HGB 14.8 08/19/2023   HCT 44.0 08/19/2023   MCV 88.4 08/19/2023   PLT 282 08/19/2023   Lab Results  Component Value Date   NA 141 08/19/2023   K 4.7 08/19/2023   CL 106 08/19/2023   CO2 30 08/19/2023   Lab Results  Component Value Date   ALT 11 08/19/2023   AST 14 (L) 08/19/2023   ALKPHOS 65 08/19/2023   BILITOT 0.5 08/19/2023      RADIOGRAPHY: No results found.      IMPRESSION/PLAN: 1. Stage IA, pT1cN0M0, grade 2, ER/PR positive invasive ductal carcinoma of the left breast. Dr. Dewey has reviewed her final pathology results and her course to date.  The patient has done well since surgery and based on Oncotype DX, is not going to receive systemic chemo.  Dr. Dewey recommends external radiotherapy to the breast  to reduce risks of local recurrence. Dr. Lanny anticipates adjuvant antiestrogen therapy to follow. We discussed the risks, benefits, short, and long term effects of radiotherapy, as well as the curative intent, and the patient is interested in proceeding. Dr. Dewey discusses the delivery and logistics of radiotherapy and anticipates a course of 4 weeks of radiotherapy to the left breast with deep inspiration breath-hold technique.  She will simulate today. Written consent is obtained and placed in the chart, a copy was provided to the patient.     In a visit lasting 45 minutes, greater than 50% of the time was spent face to face reviewing her case, as well as in preparation of, discussing, and coordinating the patient's care.     Donald  KYM Husband, PAC     **Disclaimer: This note was dictated with  voice recognition software. Similar sounding words can inadvertently be transcribed and this note may contain transcription errors which may not have been corrected upon publication of note.**

## 2023-10-06 ENCOUNTER — Ambulatory Visit

## 2023-10-06 DIAGNOSIS — Z9189 Other specified personal risk factors, not elsewhere classified: Secondary | ICD-10-CM

## 2023-10-06 DIAGNOSIS — C50212 Malignant neoplasm of upper-inner quadrant of left female breast: Secondary | ICD-10-CM | POA: Diagnosis not present

## 2023-10-06 DIAGNOSIS — R293 Abnormal posture: Secondary | ICD-10-CM

## 2023-10-06 DIAGNOSIS — Z483 Aftercare following surgery for neoplasm: Secondary | ICD-10-CM

## 2023-10-06 NOTE — Therapy (Signed)
 OUTPATIENT PHYSICAL THERAPY BREAST CANCER TREATMENT   Patient Name: Tara Johnson MRN: [id number] DOB:06-Nov-1960, 63 y.o., female Today's Date: 10/06/2023  END OF SESSION:  PT End of Session - 10/06/23 1209     Visit Number 3    Number of Visits 8    Date for PT Re-Evaluation 10/28/23    Authorization Type 1206    PT Start Time 1206    PT Stop Time 1300    PT Time Calculation (min) 54 min    Activity Tolerance Patient tolerated treatment well    Behavior During Therapy WFL for tasks assessed/performed          Past Medical History:  Diagnosis Date   Arthritis    lower back and bilateral knees   Asperger's syndrome    High functioning   Asthma    associated with upper respiratory infection use inhaler as needed   Autism    high functioning   Breast cancer (HCC) 07/2023   left breast IDC   Cancer (HCC)    precancerous lesions on skin 20 plus   Chronic back pain    Closed fracture of right distal radius    Complication of anesthesia    prolonged sedation   Diabetes mellitus without complication (HCC)    Diabetic neuropathy (HCC)    Family history of breast cancer    Family history of ovarian cancer    Hip pain    Hypertension    Hypothyroidism    Osteopenia    Thyroid  disease    Vitamin D deficiency    Past Surgical History:  Procedure Laterality Date   BREAST BIOPSY     years ago unsure of side   BREAST BIOPSY Left 08/07/2023   US  LT BREAST BX W LOC DEV 1ST LESION IMG BX SPEC US  GUIDE 08/07/2023 [facility]   BREAST BIOPSY Left 09/01/2023   US  LT RADIOACTIVE SEED LOC 09/01/2023 [facility]   BREAST LUMPECTOMY WITH RADIOACTIVE SEED AND SENTINEL LYMPH NODE BIOPSY Left 09/02/2023   Procedure: BREAST LUMPECTOMY WITH RADIOACTIVE SEED AND SENTINEL LYMPH NODE BIOPSY;  Surgeon: Curvin Deward MOULD, MD;  Location: [facility];  Service: General;  Laterality: Left;  GEN w/PEC BLOCK LEFT BREAST RADIOACTIVE SEED LOCALIZED LUMPECTOMY SENTINEL NODE  BIOPSY   CHOLECYSTECTOMY     DILATATION & CURETTAGE/HYSTEROSCOPY WITH MYOSURE N/A 09/26/2015   Procedure: DILATATION & CURETTAGE/HYSTEROSCOPY WITH MYOSURE;  Surgeon: Rexene Hoit, MD;  Location: [facility];  Service: Gynecology;  Laterality: N/A;  Polypectomy   DILATATION & CURETTAGE/HYSTEROSCOPY WITH MYOSURE N/A 10/12/2019   Procedure: DILATATION & CURETTAGE/HYSTEROSCOPY, POLYPECTOMY WITH MYOSURE;  Surgeon: Hoit Rexene, MD;  Location: [facility];  Service: Gynecology;  Laterality: N/A;   OPEN REDUCTION INTERNAL FIXATION (ORIF) DISTAL RADIAL FRACTURE Right 04/19/2018   Procedure: OPEN TREATMENT OF RIGHT DISTAL RADIUS;  Surgeon: Sebastian Lenis, MD;  Location: [facility];  Service: Orthopedics;  Laterality: Right;   Patient Active Problem List   Diagnosis Date Noted   Genetic testing 08/31/2023   Family history of ovarian cancer 08/19/2023   Family history of breast cancer    Malignant neoplasm of upper-inner quadrant of left breast in female, estrogen receptor positive (HCC) 08/17/2023   Postmenopausal bleeding 09/26/2015   Endometrial polyp 09/26/2015    REFERRING PROVIDER: Dr. Deward Curvin  REFERRING DIAG: Left breast cancer  THERAPY DIAG:  Malignant neoplasm of upper-inner quadrant of left breast in female, estrogen receptor positive (HCC)  Abnormal posture  At risk  for lymphedema  Aftercare following surgery for neoplasm  Rationale for Evaluation and Treatment: Rehabilitation  ONSET DATE: 07/20/23  SUBJECTIVE:                                                                                                                                                                                           SUBJECTIVE STATEMENT: The medicine they gave me really helped the fungal infection. The pain from that is gone and now I just have tenderness where my scar tissue is.    PERTINENT HISTORY:  Post left lumpectomy with SLNB on 09/02/23 with 6 negative lymph nodes removed.  Will have radiation.  Patient was diagnosed on 07/20/2023 with left grade 2 invasive ductal carcinoma breast cancer. It measures 1.1 cm and is located in the upper inner quadrant. It is ER/PR positive and HER2 negative with a Ki67 of 1%. She reports that she has autism, diabetes and is on Ozempic for weight loss.   PATIENT GOALS:  Reassess how my recovery is going related to arm function, pain, and swelling.  PAIN:  Are you having pain? No, just tenderness at scar tissue  PRECAUTIONS: Recent Surgery, left UE Lymphedema risk  RED FLAGS: None   ACTIVITY LEVEL / LEISURE: I can't hold a jar like I usually do.     OBJECTIVE:   PATIENT SURVEYS:  QUICK DASH: 38% from 6%   OBSERVATIONS: Wearing a regular bra    POSTURE:  Rounded shoulders,  sitting with left shoulder elevated    LYMPHEDEMA ASSESSMENT:   UPPER EXTREMITY AROM/PROM:   A/PROM RIGHT   eval    Shoulder extension 60  Shoulder flexion 137  Shoulder abduction 152  Shoulder internal rotation 69  Shoulder external rotation 71                          (Blank rows = not tested)   A/PROM LEFT   eval 09/30/23  Shoulder extension 60 60  Shoulder flexion 118 110 increased burning and pain  / 125 with fungal infection noted in axilla - took photo and sent note to [name], NP  Shoulder abduction 152 98 with increased pain / 120  Shoulder internal rotation 50 50  Shoulder external rotation 58 80                          (Blank rows = not tested)   CERVICAL AROM: All within normal limits   UPPER EXTREMITY STRENGTH: WFL   LYMPHEDEMA ASSESSMENTS (in cm):    LANDMARK RIGHT   eval  10 cm  proximal to olecranon process 37  Olecranon process 26.5  10 cm proximal to ulnar styloid process 22.4  Just proximal to ulnar styloid process 16  Across hand at thumb web space 18.3  At base of 2nd digit 6.3  (Blank rows = not tested)   LANDMARK LEFT   eval  10 cm proximal to olecranon process 37.9  Olecranon process 27.7   10 cm proximal to ulnar styloid process 22.9  Just proximal to ulnar styloid process 16.3  Across hand at thumb web space 18.5  At base of 2nd digit 6.3  (Blank rows = not tested)  TODAYS TREATMENT 10/06/23: Manual Therapy P/ROM to Lt shoulder into flex, abd and D2 with scapular depression by therapist, pt with mod tenderness near superior scap STM to Lt lateral trunk and pect insertion, then into Rt S/L for STM with cocoa butter to medial and lateral borders and UT where trigger points palpable Scap Mobs in Rt S/L into protraction and retraction MFR to cording in antecubital fossa Therapeutic Exercises Pulleys into flex and abd x 2 mins each returning therapist demo and tactile and VC's to decrease Lt scap compensation Roll yellow ball up wall into flex x 10  09/30/23 Re-eval performed Sent note and educated pt about fungal infection and irritation in the axilla which may be causing some of the burning feeling.  Educated pt on updated HEP per below with performance of each.  Discussed POC.   PATIENT EDUCATION:  Education details: per today's note Person educated: Patient Education method: Explanation, Demonstration, Tactile cues, Verbal cues, and Handouts Education comprehension: verbalized understanding and returned demonstration  HOME EXERCISE PROGRAM: Reviewed previously given post op HEP. Access Code: [email or web address] URL: [email or web address] Date: 09/30/2023 Prepared by: Saddie Raw  Exercises - Supine Shoulder Flexion Extension AAROM with Dowel  - 1-2 x daily - 7 x weekly - 10 reps - 5 seconds hold - Supine Chest Stretch with Elbows Bent  - 1-2 x daily - 7 x weekly - 1 sets - 10 reps - 5 sec hold - Seated Upper Trapezius Stretch  - 2 x daily - 7 x weekly - 3 reps - 30 seconds hold - Seated Scapular Retraction  - 1-3 x daily - 7 x weekly - 1 sets - 10 reps - 2-3 seconds hold - Standing Shoulder Abduction Finger Walk at Wall  - 1-2 x daily - 7 x weekly - 1 sets -  5 reps - 3 sec hold  ASSESSMENT:  CLINICAL IMPRESSION: Continued with manual therapy working to decrease Lt upper quadrant tightness. Added scap mobs and STM to periscapular area and Lt UT where pt palpably tight and tender with trigger points present. Then added AA/ROM stretches focusing on decreasing Lt scap compensation. Pt reports feeling looser at end of session. Showed her what type of shoulder pulleys to order on [employer] as she reports interest in this.   Pt will benefit from skilled therapeutic intervention to improve on the following deficits: Decreased knowledge of precautions, impaired UE functional use, pain, decreased ROM, postural dysfunction.   PT treatment/interventions: ADL/Self care home management, [phone]- PT Re-evaluation, 97110-Therapeutic exercises, 97530- Therapeutic activity, [id number]- Neuromuscular re-education, 97535- Self Care, and [id number]- Manual therapy   GOALS: Goals reviewed with patient? Yes  LONG TERM GOALS:  (STG=LTG)  GOALS Name Target Date  Goal status  1 Pt will demonstrate she has regained full shoulder ROM and function post operatively compared to baselines.  Baseline: 10/28/23 INITIAL  2 Pt will be ind with  final HEP for radiation and after 10/28/23 INITIAL  3 Pt will be educated on personal lymphedema risk and surveillance principles  10/28/23 INITIAL  4 Pt will decrease QDASH to 10% or less 10/28/23 INITIAL     PLAN:  PT FREQUENCY/DURATION: 1-2x per week x 3 weeks  PLAN FOR NEXT SESSION: Cont Lt shoulder PROM/AAROM, neck stretches, monitor fungal infection   [facility]  [address], [address]  [place] [address]  [phone]   Aden Berwyn Caldron, PTA 10/06/2023, 1:06 PM

## 2023-10-07 ENCOUNTER — Ambulatory Visit
Admission: RE | Admit: 2023-10-07 | Discharge: 2023-10-07 | Disposition: A | Discharge status: Home / Self Care | Source: Ambulatory Visit | Attending: Radiation Oncology | Admitting: Radiation Oncology

## 2023-10-07 ENCOUNTER — Encounter: Payer: Self-pay | Admitting: Radiation Oncology

## 2023-10-07 ENCOUNTER — Ambulatory Visit
Admission: RE | Admit: 2023-10-07 | Discharge: 2023-10-07 | Discharge status: Home / Self Care | Source: Ambulatory Visit | Attending: Radiation Oncology | Admitting: Radiation Oncology

## 2023-10-07 VITALS — BP 155/104 | HR 81 | Temp 97.8°F | Resp 20 | Ht 64.0 in | Wt 247.4 lb

## 2023-10-07 DIAGNOSIS — Z170 Estrogen receptor positive status [ER+]: Secondary | ICD-10-CM

## 2023-10-07 DIAGNOSIS — C50212 Malignant neoplasm of upper-inner quadrant of left female breast: Secondary | ICD-10-CM

## 2023-10-07 DIAGNOSIS — Z510 Encounter for antineoplastic radiation therapy: Secondary | ICD-10-CM | POA: Diagnosis not present

## 2023-10-08 ENCOUNTER — Encounter: Payer: Self-pay | Admitting: *Deleted

## 2023-10-08 DIAGNOSIS — C50212 Malignant neoplasm of upper-inner quadrant of left female breast: Secondary | ICD-10-CM

## 2023-10-09 ENCOUNTER — Ambulatory Visit

## 2023-10-09 DIAGNOSIS — C50212 Malignant neoplasm of upper-inner quadrant of left female breast: Secondary | ICD-10-CM

## 2023-10-09 DIAGNOSIS — Z9189 Other specified personal risk factors, not elsewhere classified: Secondary | ICD-10-CM

## 2023-10-09 DIAGNOSIS — R293 Abnormal posture: Secondary | ICD-10-CM

## 2023-10-09 DIAGNOSIS — Z483 Aftercare following surgery for neoplasm: Secondary | ICD-10-CM

## 2023-10-09 NOTE — Therapy (Signed)
 OUTPATIENT PHYSICAL THERAPY BREAST CANCER TREATMENT   Patient Name: Tara Johnson MRN: [id number] DOB:October 21, 1960, 63 y.o., female Today's Date: 10/09/2023  END OF SESSION:  PT End of Session - 10/09/23 1010     Visit Number 4    Number of Visits 8    Date for PT Re-Evaluation 10/28/23    Authorization Type none needed    PT Start Time 1006    PT Stop Time 1100    PT Time Calculation (min) 54 min    Activity Tolerance Patient tolerated treatment well    Behavior During Therapy WFL for tasks assessed/performed          Past Medical History:  Diagnosis Date   Arthritis    lower back and bilateral knees   Asperger's syndrome    High functioning   Asthma    associated with upper respiratory infection use inhaler as needed   Autism    high functioning   Breast cancer (HCC) 07/2023   left breast IDC   Cancer (HCC)    precancerous lesions on skin 20 plus   Chronic back pain    Closed fracture of right distal radius    Complication of anesthesia    prolonged sedation   Diabetes mellitus without complication (HCC)    Diabetic neuropathy (HCC)    Family history of breast cancer    Family history of ovarian cancer    Hip pain    Hypertension    Hypothyroidism    Osteopenia    Thyroid  disease    Vitamin D deficiency    Past Surgical History:  Procedure Laterality Date   BREAST BIOPSY     years ago unsure of side   BREAST BIOPSY Left 08/07/2023   US  LT BREAST BX W LOC DEV 1ST LESION IMG BX SPEC US  GUIDE 08/07/2023 [facility]   BREAST BIOPSY Left 09/01/2023   US  LT RADIOACTIVE SEED LOC 09/01/2023 [facility]   BREAST LUMPECTOMY WITH RADIOACTIVE SEED AND SENTINEL LYMPH NODE BIOPSY Left 09/02/2023   Procedure: BREAST LUMPECTOMY WITH RADIOACTIVE SEED AND SENTINEL LYMPH NODE BIOPSY;  Surgeon: Curvin Deward MOULD, MD;  Location: [facility];  Service: General;  Laterality: Left;  GEN w/PEC BLOCK LEFT BREAST RADIOACTIVE SEED LOCALIZED LUMPECTOMY SENTINEL  NODE BIOPSY   CHOLECYSTECTOMY     DILATATION & CURETTAGE/HYSTEROSCOPY WITH MYOSURE N/A 09/26/2015   Procedure: DILATATION & CURETTAGE/HYSTEROSCOPY WITH MYOSURE;  Surgeon: Rexene Hoit, MD;  Location: [facility];  Service: Gynecology;  Laterality: N/A;  Polypectomy   DILATATION & CURETTAGE/HYSTEROSCOPY WITH MYOSURE N/A 10/12/2019   Procedure: DILATATION & CURETTAGE/HYSTEROSCOPY, POLYPECTOMY WITH MYOSURE;  Surgeon: Hoit Rexene, MD;  Location: [facility];  Service: Gynecology;  Laterality: N/A;   OPEN REDUCTION INTERNAL FIXATION (ORIF) DISTAL RADIAL FRACTURE Right 04/19/2018   Procedure: OPEN TREATMENT OF RIGHT DISTAL RADIUS;  Surgeon: Sebastian Lenis, MD;  Location: [facility];  Service: Orthopedics;  Laterality: Right;   Patient Active Problem List   Diagnosis Date Noted   Genetic testing 08/31/2023   Family history of ovarian cancer 08/19/2023   Family history of breast cancer    Malignant neoplasm of upper-inner quadrant of left breast in female, estrogen receptor positive (HCC) 08/17/2023   Postmenopausal bleeding 09/26/2015   Endometrial polyp 09/26/2015    REFERRING PROVIDER: Dr. Deward Curvin  REFERRING DIAG: Left breast cancer  THERAPY DIAG:  Malignant neoplasm of upper-inner quadrant of left breast in female, estrogen receptor positive (HCC)  Abnormal posture  At  risk for lymphedema  Aftercare following surgery for neoplasm  Rationale for Evaluation and Treatment: Rehabilitation  ONSET DATE: 07/20/23  SUBJECTIVE:                                                                                                                                                                                           SUBJECTIVE STATEMENT: I apparently had a seroma because it opened yesterday when I first got into work. My shirt ended up soaked and I called they doctor. They said as long as it didn't seem infected it was okay. When I woke up this morning it was a very small amount  of drainage so it must be improving.    PERTINENT HISTORY:  Post left lumpectomy with SLNB on 09/02/23 with 6 negative lymph nodes removed. Will have radiation.  Patient was diagnosed on 07/20/2023 with left grade 2 invasive ductal carcinoma breast cancer. It measures 1.1 cm and is located in the upper inner quadrant. It is ER/PR positive and HER2 negative with a Ki67 of 1%. She reports that she has autism, diabetes and is on Ozempic for weight loss.   PATIENT GOALS:  Reassess how my recovery is going related to arm function, pain, and swelling.  PAIN:  Are you having pain? No, just tenderness at scar tissue  PRECAUTIONS: Recent Surgery, left UE Lymphedema risk  RED FLAGS: None   ACTIVITY LEVEL / LEISURE: I can't hold a jar like I usually do.     OBJECTIVE:   PATIENT SURVEYS:  QUICK DASH: 38% from 6%   OBSERVATIONS: Wearing a regular bra    POSTURE:  Rounded shoulders,  sitting with left shoulder elevated    LYMPHEDEMA ASSESSMENT:   UPPER EXTREMITY AROM/PROM:   A/PROM RIGHT   eval    Shoulder extension 60  Shoulder flexion 137  Shoulder abduction 152  Shoulder internal rotation 69  Shoulder external rotation 71                          (Blank rows = not tested)   A/PROM LEFT   eval 09/30/23  Shoulder extension 60 60  Shoulder flexion 118 110 increased burning and pain  / 125 with fungal infection noted in axilla - took photo and sent note to [name], NP  Shoulder abduction 152 98 with increased pain / 120  Shoulder internal rotation 50 50  Shoulder external rotation 58 80                          (Blank rows = not tested)  CERVICAL AROM: All within normal limits   UPPER EXTREMITY STRENGTH: WFL   LYMPHEDEMA ASSESSMENTS (in cm):    LANDMARK RIGHT   eval  10 cm proximal to olecranon process 37  Olecranon process 26.5  10 cm proximal to ulnar styloid process 22.4  Just proximal to ulnar styloid process 16  Across hand at thumb web space 18.3  At  base of 2nd digit 6.3  (Blank rows = not tested)   LANDMARK LEFT   eval  10 cm proximal to olecranon process 37.9  Olecranon process 27.7  10 cm proximal to ulnar styloid process 22.9  Just proximal to ulnar styloid process 16.3  Across hand at thumb web space 18.5  At base of 2nd digit 6.3  (Blank rows = not tested)  TODAYS TREATMENT 10/09/23: Manual Therapy P/ROM to Lt shoulder into flex, abd and D2 with scapular depression by therapist, pt conts with mod tenderness near superior scap STM to Lt lateral trunk and pect insertion, then seated EOB at end of session for STM to Lt UT MFR to cording near axilla but avoided pulling at new opening where seroma still draining.  With gloves removed pts gauze and paper tape covering over new opening and seroma cont to very mildly drain during session. After session applied non adherent dressing and paper tape back over opening. Also issued more non adherent dressings for her to use over the weekend.   10/06/23: Manual Therapy P/ROM to Lt shoulder into flex, abd and D2 with scapular depression by therapist, pt with mod tenderness near superior scap STM to Lt lateral trunk and pect insertion, then into Rt S/L for STM with cocoa butter to medial and lateral borders and UT where trigger points palpable Scap Mobs in Rt S/L into protraction and retraction MFR to cording in antecubital fossa Therapeutic Exercises Pulleys into flex and abd x 2 mins each returning therapist demo and tactile and VC's to decrease Lt scap compensation Roll yellow ball up wall into flex x 10  09/30/23 Re-eval performed Sent note and educated pt about fungal infection and irritation in the axilla which may be causing some of the burning feeling.  Educated pt on updated HEP per below with performance of each.  Discussed POC.   PATIENT EDUCATION:  Education details: per today's note Person educated: Patient Education method: Explanation, Demonstration, Tactile cues,  Verbal cues, and Handouts Education comprehension: verbalized understanding and returned demonstration  HOME EXERCISE PROGRAM: Reviewed previously given post op HEP. Access Code: [email or web address] URL: [email or web address] Date: 09/30/2023 Prepared by: Saddie Raw  Exercises - Supine Shoulder Flexion Extension AAROM with Dowel  - 1-2 x daily - 7 x weekly - 10 reps - 5 seconds hold - Supine Chest Stretch with Elbows Bent  - 1-2 x daily - 7 x weekly - 1 sets - 10 reps - 5 sec hold - Seated Upper Trapezius Stretch  - 2 x daily - 7 x weekly - 3 reps - 30 seconds hold - Seated Scapular Retraction  - 1-3 x daily - 7 x weekly - 1 sets - 10 reps - 2-3 seconds hold - Standing Shoulder Abduction Finger Walk at Wall  - 1-2 x daily - 7 x weekly - 1 sets - 5 reps - 3 sec hold  ASSESSMENT:  CLINICAL IMPRESSION: Pt reports she had a seroma that opened yesterday. Still draining this morning but much less so. Held off on AA/ROM today due to drainage and focused on manual therapy working to decrease Lt  upper quadrant tightness while being mindful of new opening.   Pt will benefit from skilled therapeutic intervention to improve on the following deficits: Decreased knowledge of precautions, impaired UE functional use, pain, decreased ROM, postural dysfunction.   PT treatment/interventions: ADL/Self care home management, [phone]- PT Re-evaluation, 97110-Therapeutic exercises, 97530- Therapeutic activity, [id number]- Neuromuscular re-education, 97535- Self Care, and 02859- Manual therapy   GOALS: Goals reviewed with patient? Yes  LONG TERM GOALS:  (STG=LTG)  GOALS Name Target Date  Goal status  1 Pt will demonstrate she has regained full shoulder ROM and function post operatively compared to baselines.  Baseline: 10/28/23 INITIAL  2 Pt will be ind with final HEP for radiation and after 10/28/23 INITIAL  3 Pt will be educated on personal lymphedema risk and surveillance principles  10/28/23 INITIAL  4 Pt  will decrease QDASH to 10% or less 10/28/23 INITIAL     PLAN:  PT FREQUENCY/DURATION: 1-2x per week x 3 weeks  PLAN FOR NEXT SESSION: Cont Lt shoulder PROM/AAROM, neck stretches, monitor fungal infection; how is new area of opening that seroma drained from   [facility]  [address]  [place] [address]  [phone]   Aden Berwyn Caldron, PTA 10/09/2023, 12:10 PM

## 2023-10-13 ENCOUNTER — Ambulatory Visit

## 2023-10-13 DIAGNOSIS — R293 Abnormal posture: Secondary | ICD-10-CM

## 2023-10-13 DIAGNOSIS — C50212 Malignant neoplasm of upper-inner quadrant of left female breast: Secondary | ICD-10-CM

## 2023-10-13 DIAGNOSIS — Z483 Aftercare following surgery for neoplasm: Secondary | ICD-10-CM

## 2023-10-13 DIAGNOSIS — Z9189 Other specified personal risk factors, not elsewhere classified: Secondary | ICD-10-CM

## 2023-10-13 NOTE — Therapy (Signed)
 OUTPATIENT PHYSICAL THERAPY BREAST CANCER TREATMENT   Patient Name: Tara Johnson MRN: [id number] DOB:Sep 04, 1960, 63 y.o., female Today's Date: 10/13/2023  END OF SESSION:  PT End of Session - 10/13/23 0803     Visit Number 5    Number of Visits 8    Date for PT Re-Evaluation 10/28/23    Authorization Type none needed    PT Start Time 0801    PT Stop Time 0856    PT Time Calculation (min) 55 min    Activity Tolerance Patient tolerated treatment well    Behavior During Therapy [facility] for tasks assessed/performed          Past Medical History:  Diagnosis Date   Arthritis    lower back and bilateral knees   Asperger's syndrome    High functioning   Asthma    associated with upper respiratory infection use inhaler as needed   Autism    high functioning   Breast cancer (HCC) 07/2023   left breast IDC   Cancer (HCC)    precancerous lesions on skin 20 plus   Chronic back pain    Closed fracture of right distal radius    Complication of anesthesia    prolonged sedation   Diabetes mellitus without complication (HCC)    Diabetic neuropathy (HCC)    Family history of breast cancer    Family history of ovarian cancer    Hip pain    Hypertension    Hypothyroidism    Osteopenia    Thyroid  disease    Vitamin D deficiency    Past Surgical History:  Procedure Laterality Date   BREAST BIOPSY     years ago unsure of side   BREAST BIOPSY Left 08/07/2023   US  LT BREAST BX W LOC DEV 1ST LESION IMG BX SPEC US  GUIDE 08/07/2023 [facility]   BREAST BIOPSY Left 09/01/2023   US  LT RADIOACTIVE SEED LOC 09/01/2023 [facility]   BREAST LUMPECTOMY WITH RADIOACTIVE SEED AND SENTINEL LYMPH NODE BIOPSY Left 09/02/2023   Procedure: BREAST LUMPECTOMY WITH RADIOACTIVE SEED AND SENTINEL LYMPH NODE BIOPSY;  Surgeon: Curvin Deward MOULD, MD;  Location: [facility];  Service: General;  Laterality: Left;  GEN w/PEC BLOCK LEFT BREAST RADIOACTIVE SEED LOCALIZED LUMPECTOMY SENTINEL  NODE BIOPSY   CHOLECYSTECTOMY     DILATATION & CURETTAGE/HYSTEROSCOPY WITH MYOSURE N/A 09/26/2015   Procedure: DILATATION & CURETTAGE/HYSTEROSCOPY WITH MYOSURE;  Surgeon: Rexene Hoit, MD;  Location: [facility];  Service: Gynecology;  Laterality: N/A;  Polypectomy   DILATATION & CURETTAGE/HYSTEROSCOPY WITH MYOSURE N/A 10/12/2019   Procedure: DILATATION & CURETTAGE/HYSTEROSCOPY, POLYPECTOMY WITH MYOSURE;  Surgeon: Hoit Rexene, MD;  Location: [facility];  Service: Gynecology;  Laterality: N/A;   OPEN REDUCTION INTERNAL FIXATION (ORIF) DISTAL RADIAL FRACTURE Right 04/19/2018   Procedure: OPEN TREATMENT OF RIGHT DISTAL RADIUS;  Surgeon: Sebastian Lenis, MD;  Location: [facility];  Service: Orthopedics;  Laterality: Right;   Patient Active Problem List   Diagnosis Date Noted   Genetic testing 08/31/2023   Family history of ovarian cancer 08/19/2023   Family history of breast cancer    Malignant neoplasm of upper-inner quadrant of left breast in female, estrogen receptor positive (HCC) 08/17/2023   Postmenopausal bleeding 09/26/2015   Endometrial polyp 09/26/2015    REFERRING PROVIDER: Dr. Deward Curvin  REFERRING DIAG: Left breast cancer  THERAPY DIAG:  Malignant neoplasm of upper-inner quadrant of left breast in female, estrogen receptor positive (HCC)  Abnormal posture  At  risk for lymphedema  Aftercare following surgery for neoplasm  Rationale for Evaluation and Treatment: Rehabilitation  ONSET DATE: 07/20/23  SUBJECTIVE:                                                                                                                                                                                           SUBJECTIVE STATEMENT: I think the seroma is closing well. The drainage continued to taper off when I was here the other day and I didn't notice any yesterday. I can also tell the motion has imrpoved since the seroma drained. I felt good after last session.    PERTINENT HISTORY:  Post left lumpectomy with SLNB on 09/02/23 with 6 negative lymph nodes removed. Will have radiation.  Patient was diagnosed on 07/20/2023 with left grade 2 invasive ductal carcinoma breast cancer. It measures 1.1 cm and is located in the upper inner quadrant. It is ER/PR positive and HER2 negative with a Ki67 of 1%. She reports that she has autism, diabetes and is on Ozempic for weight loss.   PATIENT GOALS:  Reassess how my recovery is going related to arm function, pain, and swelling.  PAIN:  Are you having pain? No, just tenderness at scar tissue  PRECAUTIONS: Recent Surgery, left UE Lymphedema risk  RED FLAGS: None   ACTIVITY LEVEL / LEISURE: I can't hold a jar like I usually do.     OBJECTIVE:   PATIENT SURVEYS:  QUICK DASH: 38% from 6%   OBSERVATIONS: Wearing a regular bra    POSTURE:  Rounded shoulders,  sitting with left shoulder elevated    LYMPHEDEMA ASSESSMENT:   UPPER EXTREMITY AROM/PROM:   A/PROM RIGHT   eval    Shoulder extension 60  Shoulder flexion 137  Shoulder abduction 152  Shoulder internal rotation 69  Shoulder external rotation 71                          (Blank rows = not tested)   A/PROM LEFT   eval 09/30/23  Shoulder extension 60 60  Shoulder flexion 118 110 increased burning and pain  / 125 with fungal infection noted in axilla - took photo and sent note to [name], NP  Shoulder abduction 152 98 with increased pain / 120  Shoulder internal rotation 50 50  Shoulder external rotation 58 80                          (Blank rows = not tested)   CERVICAL AROM: All within normal limits   UPPER EXTREMITY STRENGTH: [facility]  LYMPHEDEMA ASSESSMENTS (in cm):    LANDMARK RIGHT   eval  10 cm proximal to olecranon process 37  Olecranon process 26.5  10 cm proximal to ulnar styloid process 22.4  Just proximal to ulnar styloid process 16  Across hand at thumb web space 18.3  At base of 2nd digit 6.3  (Blank rows = not  tested)   LANDMARK LEFT   eval  10 cm proximal to olecranon process 37.9  Olecranon process 27.7  10 cm proximal to ulnar styloid process 22.9  Just proximal to ulnar styloid process 16.3  Across hand at thumb web space 18.5  At base of 2nd digit 6.3  (Blank rows = not tested)  TODAYS TREATMENT 10/13/23: Therapeutic Exercises Pulleys into flex and abd x 2 mins each returning therapist demo Roll peanut up wall into flex and Lt UE abd x 10 each returning demo Manual Therapy P/ROM to Lt shoulder into flex, abd and D2 with scapular depression by therapist, pt conts with mod tenderness near superior scap STM to Lt lateral trunk and pect insertion, then seated EOB at end of session for STM to Lt UT MFR to cording near axilla but avoided pulling where seroma healing  10/09/23: Manual Therapy P/ROM to Lt shoulder into flex, abd and D2 with scapular depression by therapist, pt conts with mod tenderness near superior scap STM to Lt lateral trunk and pect insertion, then seated EOB at end of session for STM to Lt UT MFR to cording near axilla but avoided pulling at new opening where seroma still draining.  With gloves removed pts gauze and paper tape covering over new opening and seroma cont to very mildly drain during session. After session applied non adherent dressing and paper tape back over opening. Also issued more non adherent dressings for her to use over the weekend.   10/06/23: Manual Therapy P/ROM to Lt shoulder into flex, abd and D2 with scapular depression by therapist, pt with mod tenderness near superior scap STM to Lt lateral trunk and pect insertion, then into Rt S/L for STM with cocoa butter to medial and lateral borders and UT where trigger points palpable Scap Mobs in Rt S/L into protraction and retraction MFR to cording in antecubital fossa Therapeutic Exercises Pulleys into flex and abd x 2 mins each returning therapist demo and tactile and VC's to decrease Lt scap  compensation Roll yellow ball up wall into flex x 10  09/30/23 Re-eval performed Sent note and educated pt about fungal infection and irritation in the axilla which may be causing some of the burning feeling.  Educated pt on updated HEP per below with performance of each.  Discussed POC.   PATIENT EDUCATION:  Education details: per today's note Person educated: Patient Education method: Explanation, Demonstration, Tactile cues, Verbal cues, and Handouts Education comprehension: verbalized understanding and returned demonstration  HOME EXERCISE PROGRAM: Reviewed previously given post op HEP. Access Code: [email or web address] URL: [email or web address] Date: 09/30/2023 Prepared by: Saddie Raw  Exercises - Supine Shoulder Flexion Extension AAROM with Dowel  - 1-2 x daily - 7 x weekly - 10 reps - 5 seconds hold - Supine Chest Stretch with Elbows Bent  - 1-2 x daily - 7 x weekly - 1 sets - 10 reps - 5 sec hold - Seated Upper Trapezius Stretch  - 2 x daily - 7 x weekly - 3 reps - 30 seconds hold - Seated Scapular Retraction  - 1-3 x daily - 7 x weekly - 1 sets -  10 reps - 2-3 seconds hold - Standing Shoulder Abduction Finger Walk at Wall  - 1-2 x daily - 7 x weekly - 1 sets - 5 reps - 3 sec hold  ASSESSMENT:  CLINICAL IMPRESSION: Seroma seems to be healing well. Pts is noticing her Lt shoulder A/ROM is steadily improving, especially since seroma drained. Progressed her to begin AA/ROM exs which she tolerated well and reported feeling good stretches with.   Pt will benefit from skilled therapeutic intervention to improve on the following deficits: Decreased knowledge of precautions, impaired UE functional use, pain, decreased ROM, postural dysfunction.   PT treatment/interventions: ADL/Self care home management, [phone]- PT Re-evaluation, 97110-Therapeutic exercises, 97530- Therapeutic activity, [id number]- Neuromuscular re-education, 97535- Self Care, and 02859- Manual  therapy   GOALS: Goals reviewed with patient? Yes  LONG TERM GOALS:  (STG=LTG)  GOALS Name Target Date  Goal status  1 Pt will demonstrate she has regained full shoulder ROM and function post operatively compared to baselines.  Baseline: 10/28/23 INITIAL  2 Pt will be ind with final HEP for radiation and after 10/28/23 INITIAL  3 Pt will be educated on personal lymphedema risk and surveillance principles  10/28/23 INITIAL  4 Pt will decrease QDASH to 10% or less 10/28/23 INITIAL     PLAN:  PT FREQUENCY/DURATION: 1-2x per week x 3 weeks  PLAN FOR NEXT SESSION: Cont Lt shoulder PROM/AAROM, add neck stretches   [facility]  [address], [address]  [place] [address]  [phone]   Aden Berwyn Caldron, PTA 10/13/2023, 9:01 AM

## 2023-10-14 ENCOUNTER — Telehealth: Payer: Self-pay | Admitting: Hematology

## 2023-10-14 NOTE — Telephone Encounter (Signed)
 Scheduled appointment per staff message. Called and left a VM with appointment details for the patient.

## 2023-10-15 ENCOUNTER — Ambulatory Visit

## 2023-10-15 DIAGNOSIS — C50212 Malignant neoplasm of upper-inner quadrant of left female breast: Secondary | ICD-10-CM

## 2023-10-15 DIAGNOSIS — R293 Abnormal posture: Secondary | ICD-10-CM

## 2023-10-15 DIAGNOSIS — Z483 Aftercare following surgery for neoplasm: Secondary | ICD-10-CM

## 2023-10-15 DIAGNOSIS — Z9189 Other specified personal risk factors, not elsewhere classified: Secondary | ICD-10-CM

## 2023-10-15 NOTE — Therapy (Signed)
 OUTPATIENT PHYSICAL THERAPY BREAST CANCER TREATMENT   Patient Name: Tara Johnson MRN: [id number] DOB:May 09, 1960, 63 y.o., female Today's Date: 10/15/2023  END OF SESSION:  PT End of Session - 10/15/23 0805     Visit Number 6    Number of Visits 8    Date for PT Re-Evaluation 10/28/23    Authorization Type none needed    PT Start Time 0801    PT Stop Time 0854    PT Time Calculation (min) 53 min    Activity Tolerance Patient tolerated treatment well    Behavior During Therapy [facility] for tasks assessed/performed          Past Medical History:  Diagnosis Date   Arthritis    lower back and bilateral knees   Asperger's syndrome    High functioning   Asthma    associated with upper respiratory infection use inhaler as needed   Autism    high functioning   Breast cancer (HCC) 07/2023   left breast IDC   Cancer (HCC)    precancerous lesions on skin 20 plus   Chronic back pain    Closed fracture of right distal radius    Complication of anesthesia    prolonged sedation   Diabetes mellitus without complication (HCC)    Diabetic neuropathy (HCC)    Family history of breast cancer    Family history of ovarian cancer    Hip pain    Hypertension    Hypothyroidism    Osteopenia    Thyroid  disease    Vitamin D deficiency    Past Surgical History:  Procedure Laterality Date   BREAST BIOPSY     years ago unsure of side   BREAST BIOPSY Left 08/07/2023   US  LT BREAST BX W LOC DEV 1ST LESION IMG BX SPEC US  GUIDE 08/07/2023 [facility]   BREAST BIOPSY Left 09/01/2023   US  LT RADIOACTIVE SEED LOC 09/01/2023 [facility]   BREAST LUMPECTOMY WITH RADIOACTIVE SEED AND SENTINEL LYMPH NODE BIOPSY Left 09/02/2023   Procedure: BREAST LUMPECTOMY WITH RADIOACTIVE SEED AND SENTINEL LYMPH NODE BIOPSY;  Surgeon: Curvin Deward MOULD, MD;  Location: [facility];  Service: General;  Laterality: Left;  GEN w/PEC BLOCK LEFT BREAST RADIOACTIVE SEED LOCALIZED LUMPECTOMY SENTINEL  NODE BIOPSY   CHOLECYSTECTOMY     DILATATION & CURETTAGE/HYSTEROSCOPY WITH MYOSURE N/A 09/26/2015   Procedure: DILATATION & CURETTAGE/HYSTEROSCOPY WITH MYOSURE;  Surgeon: Rexene Hoit, MD;  Location: [facility];  Service: Gynecology;  Laterality: N/A;  Polypectomy   DILATATION & CURETTAGE/HYSTEROSCOPY WITH MYOSURE N/A 10/12/2019   Procedure: DILATATION & CURETTAGE/HYSTEROSCOPY, POLYPECTOMY WITH MYOSURE;  Surgeon: Hoit Rexene, MD;  Location: [facility];  Service: Gynecology;  Laterality: N/A;   OPEN REDUCTION INTERNAL FIXATION (ORIF) DISTAL RADIAL FRACTURE Right 04/19/2018   Procedure: OPEN TREATMENT OF RIGHT DISTAL RADIUS;  Surgeon: Sebastian Lenis, MD;  Location:  [facility];  Service: Orthopedics;  Laterality: Right;   Patient Active Problem List   Diagnosis Date Noted   Genetic testing 08/31/2023   Family history of ovarian cancer 08/19/2023   Family history of breast cancer    Malignant neoplasm of upper-inner quadrant of left breast in female, estrogen receptor positive (HCC) 08/17/2023   Postmenopausal bleeding 09/26/2015   Endometrial polyp 09/26/2015    REFERRING PROVIDER: Dr. Deward Curvin  REFERRING DIAG: Left breast cancer  THERAPY DIAG:  Malignant neoplasm of upper-inner quadrant of left breast in female, estrogen receptor positive (HCC)  Abnormal posture  At  risk for lymphedema  Aftercare following surgery for neoplasm  Rationale for Evaluation and Treatment: Rehabilitation  ONSET DATE: 07/20/23  SUBJECTIVE:                                                                                                                                                                                           SUBJECTIVE STATEMENT: I'm always sore the day after you work on me but 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 But I know it's also part of my scoliosis being more effected on that side.   PERTINENT HISTORY:  Post left lumpectomy with SLNB on  09/02/23 with 6 negative lymph nodes removed. Will have radiation.  Patient was diagnosed on 07/20/2023 with left grade 2 invasive ductal carcinoma breast cancer. It measures 1.1 cm and is located in the upper inner quadrant. It is ER/PR positive and HER2 negative with a Ki67 of 1%. She reports that she has autism, diabetes and is on Ozempic for weight loss.   PATIENT GOALS:  Reassess how my recovery is going related to arm function, pain, and swelling.  PAIN:  Are you having pain? No, not today, was sore yesterday  PRECAUTIONS: Recent Surgery, left UE Lymphedema risk  RED FLAGS: None   ACTIVITY LEVEL / LEISURE: I can't hold a jar like I usually do.     OBJECTIVE:   PATIENT SURVEYS:  QUICK DASH: 38% from 6%   OBSERVATIONS: Wearing a regular bra    POSTURE:  Rounded shoulders,  sitting with left shoulder elevated    LYMPHEDEMA ASSESSMENT:   UPPER EXTREMITY AROM/PROM:   A/PROM RIGHT   eval    Shoulder extension 60  Shoulder flexion 137  Shoulder abduction 152  Shoulder internal rotation 69  Shoulder external rotation 71                          (Blank rows = not tested)   A/PROM LEFT   eval 09/30/23  Shoulder extension 60 60  Shoulder flexion 118 110 increased burning and pain  / 125 with fungal infection noted in axilla - took photo and sent note to [name], NP  Shoulder abduction 152 98 with increased pain / 120  Shoulder internal rotation 50 50  Shoulder external rotation 58 80                          (Blank rows = not tested)   CERVICAL AROM: All within normal limits   UPPER EXTREMITY STRENGTH: WFL   LYMPHEDEMA ASSESSMENTS (in cm):  LANDMARK RIGHT   eval  10 cm proximal to olecranon process 37  Olecranon process 26.5  10 cm proximal to ulnar styloid process 22.4  Just proximal to ulnar styloid process 16  Across hand at thumb web space 18.3  At base of 2nd digit 6.3  (Blank rows = not tested)   LANDMARK LEFT   eval  10 cm proximal to  olecranon process 37.9  Olecranon process 27.7  10 cm proximal to ulnar styloid process 22.9  Just proximal to ulnar styloid process 16.3  Across hand at thumb web space 18.5  At base of 2nd digit 6.3  (Blank rows = not tested)  TODAYS TREATMENT 10/15/23: Manual Therapy P/ROM to Lt shoulder into flex, abd and D2 with scapular depression by therapist, pt conts with mod tenderness near superior scap STM to Lt lateral trunk and pect insertion, then Rt S/L for STM to Lt UT gently and medial scap border with cocoa butter MFR to cording near axilla gently where seroma healing Therapeutic Exercises Pulleys into flex and abd x 2 mins, VC's to relax shoulder to decr Lt scap compensation Roll ball up wall into flex and Lt UE abd x 10 each  Standing with back against wall (as much as able due to scoliosis) for bil UE 3 way raises with 1# x 10 each returning therapist demo for flex, scaption, and abd with VC's for slower pace but returned good demo after that  10/13/23: Therapeutic Exercises Pulleys into flex and abd x 2 mins each returning therapist demo Roll peanut up wall into flex and Lt UE abd x 10 each returning demo Manual Therapy P/ROM to Lt shoulder into flex, abd and D2 with scapular depression by therapist, pt conts with mod tenderness near superior scap STM to Lt lateral trunk and pect insertion, then seated EOB at end of session for STM to Lt UT MFR to cording near axilla but avoided pulling where seroma healing  10/09/23: Manual Therapy P/ROM to Lt shoulder into flex, abd and D2 with scapular depression by therapist, pt conts with mod tenderness near superior scap STM to Lt lateral trunk and pect insertion, then seated EOB at end of session for STM to Lt UT MFR to cording near axilla but avoided pulling at new opening where seroma still draining.  With gloves removed pts gauze and paper tape covering over new opening and seroma cont to very mildly drain during session. After session  applied non adherent dressing and paper tape back over opening. Also issued more non adherent dressings for her to use over the weekend.   10/06/23: Manual Therapy P/ROM to Lt shoulder into flex, abd and D2 with scapular depression by therapist, pt with mod tenderness near superior scap STM to Lt lateral trunk and pect insertion, then into Rt S/L for STM with cocoa butter to medial and lateral borders and UT where trigger points palpable Scap Mobs in Rt S/L into protraction and retraction MFR to cording in antecubital fossa Therapeutic Exercises Pulleys into flex and abd x 2 mins each returning therapist demo and tactile and VC's to decrease Lt scap compensation Roll yellow ball up wall into flex x 10     PATIENT EDUCATION:  Education details: per today's note Person educated: Patient Education method: Programmer, multimedia, Facilities manager, Actor cues, Verbal cues, and Handouts Education comprehension: verbalized understanding and returned demonstration  HOME EXERCISE PROGRAM: Reviewed previously given post op HEP. Access Code: [email or web address] URL: [email or web address] Date: 09/30/2023 Prepared by: Saddie Raw  Exercises -  Supine Shoulder Flexion Extension AAROM with Dowel  - 1-2 x daily - 7 x weekly - 10 reps - 5 seconds hold - Supine Chest Stretch with Elbows Bent  - 1-2 x daily - 7 x weekly - 1 sets - 10 reps - 5 sec hold - Seated Upper Trapezius Stretch  - 2 x daily - 7 x weekly - 3 reps - 30 seconds hold - Seated Scapular Retraction  - 1-3 x daily - 7 x weekly - 1 sets - 10 reps - 2-3 seconds hold - Standing Shoulder Abduction Finger Walk at Wall  - 1-2 x daily - 7 x weekly - 1 sets - 5 reps - 3 sec hold  ASSESSMENT:  CLINICAL IMPRESSION: Good scab developing over where seroma drained. Still careful not to pull healing scab but some gentle STM near incision at area of scar tissue. Pt has been reporting increased soreness day after sessions which is normal for her but focused  on lighter pressure today to see if this will decrease amount of soreness day after. Her end P/ROM improved by ed of session. Continued with AA/ROM stretches and added gentle strength with bil UE 3 way raises which she tolerated very well.   Pt will benefit from skilled therapeutic intervention to improve on the following deficits: Decreased knowledge of precautions, impaired UE functional use, pain, decreased ROM, postural dysfunction.   PT treatment/interventions: ADL/Self care home management, [phone]- PT Re-evaluation, 97110-Therapeutic exercises, 97530- Therapeutic activity, [id number]- Neuromuscular re-education, 97535- Self Care, and 02859- Manual therapy   GOALS: Goals reviewed with patient? Yes  LONG TERM GOALS:  (STG=LTG)  GOALS Name Target Date  Goal status  1 Pt will demonstrate she has regained full shoulder ROM and function post operatively compared to baselines.  Baseline: 10/28/23 INITIAL  2 Pt will be ind with final HEP for radiation and after 10/28/23 INITIAL  3 Pt will be educated on personal lymphedema risk and surveillance principles  10/28/23 INITIAL  4 Pt will decrease QDASH to 10% or less 10/28/23 INITIAL     PLAN:  PT FREQUENCY/DURATION: 1-2x per week x 3 weeks  PLAN FOR NEXT SESSION: Cont Lt shoulder PROM/AAROM, add neck stretches   [facility]  [address], [address]  [place] [address]  [phone]   Aden Berwyn Caldron, PTA 10/15/2023, 8:59 AM

## 2023-10-16 DIAGNOSIS — Z7984 Long term (current) use of oral hypoglycemic drugs: Secondary | ICD-10-CM | POA: Insufficient documentation

## 2023-10-16 DIAGNOSIS — E559 Vitamin D deficiency, unspecified: Secondary | ICD-10-CM | POA: Insufficient documentation

## 2023-10-16 DIAGNOSIS — Z79811 Long term (current) use of aromatase inhibitors: Secondary | ICD-10-CM | POA: Insufficient documentation

## 2023-10-16 DIAGNOSIS — Z1732 Human epidermal growth factor receptor 2 negative status: Secondary | ICD-10-CM | POA: Insufficient documentation

## 2023-10-16 DIAGNOSIS — C50212 Malignant neoplasm of upper-inner quadrant of left female breast: Secondary | ICD-10-CM | POA: Insufficient documentation

## 2023-10-16 DIAGNOSIS — M25551 Pain in right hip: Secondary | ICD-10-CM | POA: Insufficient documentation

## 2023-10-16 DIAGNOSIS — M8580 Other specified disorders of bone density and structure, unspecified site: Secondary | ICD-10-CM | POA: Insufficient documentation

## 2023-10-16 DIAGNOSIS — E039 Hypothyroidism, unspecified: Secondary | ICD-10-CM | POA: Insufficient documentation

## 2023-10-16 DIAGNOSIS — E119 Type 2 diabetes mellitus without complications: Secondary | ICD-10-CM | POA: Insufficient documentation

## 2023-10-16 DIAGNOSIS — M25552 Pain in left hip: Secondary | ICD-10-CM | POA: Insufficient documentation

## 2023-10-16 DIAGNOSIS — Z7985 Long-term (current) use of injectable non-insulin antidiabetic drugs: Secondary | ICD-10-CM | POA: Insufficient documentation

## 2023-10-16 DIAGNOSIS — Z79899 Other long term (current) drug therapy: Secondary | ICD-10-CM | POA: Insufficient documentation

## 2023-10-16 DIAGNOSIS — Z510 Encounter for antineoplastic radiation therapy: Secondary | ICD-10-CM | POA: Insufficient documentation

## 2023-10-16 DIAGNOSIS — N6489 Other specified disorders of breast: Secondary | ICD-10-CM | POA: Insufficient documentation

## 2023-10-16 DIAGNOSIS — Z8041 Family history of malignant neoplasm of ovary: Secondary | ICD-10-CM | POA: Insufficient documentation

## 2023-10-16 DIAGNOSIS — Z170 Estrogen receptor positive status [ER+]: Secondary | ICD-10-CM | POA: Insufficient documentation

## 2023-10-16 DIAGNOSIS — Z803 Family history of malignant neoplasm of breast: Secondary | ICD-10-CM | POA: Insufficient documentation

## 2023-10-19 ENCOUNTER — Other Ambulatory Visit: Payer: Self-pay

## 2023-10-19 ENCOUNTER — Ambulatory Visit
Admission: RE | Admit: 2023-10-19 | Discharge: 2023-10-19 | Disposition: A | Discharge status: Home / Self Care | Source: Ambulatory Visit | Attending: Radiation Oncology | Admitting: Radiation Oncology

## 2023-10-19 DIAGNOSIS — Z803 Family history of malignant neoplasm of breast: Secondary | ICD-10-CM | POA: Diagnosis not present

## 2023-10-19 DIAGNOSIS — E119 Type 2 diabetes mellitus without complications: Secondary | ICD-10-CM | POA: Diagnosis not present

## 2023-10-19 DIAGNOSIS — M25552 Pain in left hip: Secondary | ICD-10-CM | POA: Diagnosis not present

## 2023-10-19 DIAGNOSIS — Z7984 Long term (current) use of oral hypoglycemic drugs: Secondary | ICD-10-CM | POA: Diagnosis not present

## 2023-10-19 DIAGNOSIS — Z79811 Long term (current) use of aromatase inhibitors: Secondary | ICD-10-CM | POA: Diagnosis not present

## 2023-10-19 DIAGNOSIS — Z7985 Long-term (current) use of injectable non-insulin antidiabetic drugs: Secondary | ICD-10-CM | POA: Diagnosis not present

## 2023-10-19 DIAGNOSIS — E039 Hypothyroidism, unspecified: Secondary | ICD-10-CM | POA: Diagnosis not present

## 2023-10-19 DIAGNOSIS — Z1732 Human epidermal growth factor receptor 2 negative status: Secondary | ICD-10-CM | POA: Diagnosis not present

## 2023-10-19 DIAGNOSIS — Z170 Estrogen receptor positive status [ER+]: Secondary | ICD-10-CM | POA: Diagnosis not present

## 2023-10-19 DIAGNOSIS — Z510 Encounter for antineoplastic radiation therapy: Secondary | ICD-10-CM | POA: Insufficient documentation

## 2023-10-19 DIAGNOSIS — C50212 Malignant neoplasm of upper-inner quadrant of left female breast: Secondary | ICD-10-CM | POA: Insufficient documentation

## 2023-10-19 DIAGNOSIS — E559 Vitamin D deficiency, unspecified: Secondary | ICD-10-CM | POA: Diagnosis not present

## 2023-10-19 DIAGNOSIS — Z8041 Family history of malignant neoplasm of ovary: Secondary | ICD-10-CM | POA: Diagnosis not present

## 2023-10-19 DIAGNOSIS — M25551 Pain in right hip: Secondary | ICD-10-CM | POA: Diagnosis not present

## 2023-10-19 DIAGNOSIS — Z79899 Other long term (current) drug therapy: Secondary | ICD-10-CM | POA: Diagnosis not present

## 2023-10-19 DIAGNOSIS — M8580 Other specified disorders of bone density and structure, unspecified site: Secondary | ICD-10-CM | POA: Diagnosis not present

## 2023-10-19 DIAGNOSIS — N6489 Other specified disorders of breast: Secondary | ICD-10-CM | POA: Diagnosis not present

## 2023-10-19 LAB — RAD ONC ARIA SESSION SUMMARY
Course Elapsed Days: 0
Plan Fractions Treated to Date: 1
Plan Prescribed Dose Per Fraction: 2.66 Gy
Plan Total Fractions Prescribed: 16
Plan Total Prescribed Dose: 42.56 Gy
Reference Point Dosage Given to Date: 2.66 Gy
Reference Point Session Dosage Given: 2.66 Gy
Session Number: 1

## 2023-10-20 ENCOUNTER — Encounter: Payer: Self-pay | Admitting: Rehabilitation

## 2023-10-20 ENCOUNTER — Ambulatory Visit
Admission: RE | Admit: 2023-10-20 | Discharge: 2023-10-20 | Disposition: A | Discharge status: Home / Self Care | Source: Ambulatory Visit | Attending: Radiation Oncology | Admitting: Radiation Oncology

## 2023-10-20 ENCOUNTER — Other Ambulatory Visit: Payer: Self-pay

## 2023-10-20 ENCOUNTER — Ambulatory Visit: Attending: General Surgery | Admitting: Rehabilitation

## 2023-10-20 DIAGNOSIS — Z9189 Other specified personal risk factors, not elsewhere classified: Secondary | ICD-10-CM | POA: Diagnosis present

## 2023-10-20 DIAGNOSIS — Z483 Aftercare following surgery for neoplasm: Secondary | ICD-10-CM | POA: Diagnosis present

## 2023-10-20 DIAGNOSIS — R293 Abnormal posture: Secondary | ICD-10-CM | POA: Diagnosis present

## 2023-10-20 DIAGNOSIS — C50212 Malignant neoplasm of upper-inner quadrant of left female breast: Secondary | ICD-10-CM | POA: Insufficient documentation

## 2023-10-20 DIAGNOSIS — Z170 Estrogen receptor positive status [ER+]: Secondary | ICD-10-CM | POA: Insufficient documentation

## 2023-10-20 LAB — RAD ONC ARIA SESSION SUMMARY
Course Elapsed Days: 1
Plan Fractions Treated to Date: 2
Plan Prescribed Dose Per Fraction: 2.66 Gy
Plan Total Fractions Prescribed: 16
Plan Total Prescribed Dose: 42.56 Gy
Reference Point Dosage Given to Date: 5.32 Gy
Reference Point Session Dosage Given: 2.66 Gy
Session Number: 2

## 2023-10-20 NOTE — Therapy (Signed)
 OUTPATIENT PHYSICAL THERAPY BREAST CANCER TREATMENT   Patient Name: Tara Johnson MRN: [id number] DOB:January 22, 1961, 63 y.o., female Today's Date: 10/20/2023  END OF SESSION:  PT End of Session - 10/20/23 1059     Visit Number 7    Number of Visits 8    Date for PT Re-Evaluation 10/28/23    PT Start Time 1100    PT Stop Time 1134    PT Time Calculation (min) 34 min    Activity Tolerance Patient tolerated treatment well    Behavior During Therapy WFL for tasks assessed/performed           Past Medical History:  Diagnosis Date   Arthritis    lower back and bilateral knees   Asperger's syndrome    High functioning   Asthma    associated with upper respiratory infection use inhaler as needed   Autism    high functioning   Breast cancer (HCC) 07/2023   left breast IDC   Cancer (HCC)    precancerous lesions on skin 20 plus   Chronic back pain    Closed fracture of right distal radius    Complication of anesthesia    prolonged sedation   Diabetes mellitus without complication (HCC)    Diabetic neuropathy (HCC)    Family history of breast cancer    Family history of ovarian cancer    Hip pain    Hypertension    Hypothyroidism    Osteopenia    Thyroid  disease    Vitamin D deficiency    Past Surgical History:  Procedure Laterality Date   BREAST BIOPSY     years ago unsure of side   BREAST BIOPSY Left 08/07/2023   US  LT BREAST BX W LOC DEV 1ST LESION IMG BX SPEC US  GUIDE 08/07/2023 [facility]   BREAST BIOPSY Left 09/01/2023   US  LT RADIOACTIVE SEED LOC 09/01/2023 [facility]   BREAST LUMPECTOMY WITH RADIOACTIVE SEED AND SENTINEL LYMPH NODE BIOPSY Left 09/02/2023   Procedure: BREAST LUMPECTOMY WITH RADIOACTIVE SEED AND SENTINEL LYMPH NODE BIOPSY;  Surgeon: Curvin Deward MOULD, MD;  Location: [facility];  Service: General;  Laterality: Left;  GEN w/PEC BLOCK LEFT BREAST RADIOACTIVE SEED LOCALIZED LUMPECTOMY SENTINEL NODE BIOPSY   CHOLECYSTECTOMY      DILATATION & CURETTAGE/HYSTEROSCOPY WITH MYOSURE N/A 09/26/2015   Procedure: DILATATION & CURETTAGE/HYSTEROSCOPY WITH MYOSURE;  Surgeon: Rexene Hoit, MD;  Location: [facility];  Service: Gynecology;  Laterality: N/A;  Polypectomy   DILATATION & CURETTAGE/HYSTEROSCOPY WITH MYOSURE N/A 10/12/2019   Procedure: DILATATION & CURETTAGE/HYSTEROSCOPY, POLYPECTOMY WITH MYOSURE;  Surgeon: Hoit Rexene, MD;  Location: [facility];  Service: Gynecology;  Laterality: N/A;   OPEN REDUCTION INTERNAL FIXATION (ORIF) DISTAL RADIAL FRACTURE Right 04/19/2018   Procedure: OPEN TREATMENT OF RIGHT DISTAL RADIUS;  Surgeon: Sebastian Lenis, MD;  Location: [facility];  Service: Orthopedics;  Laterality: Right;   Patient Active Problem List   Diagnosis Date Noted   Genetic testing 08/31/2023   Family history of ovarian cancer 08/19/2023   Family history of breast cancer    Malignant neoplasm of upper-inner quadrant of left breast in female, estrogen receptor positive (HCC) 08/17/2023   Postmenopausal bleeding 09/26/2015   Endometrial polyp 09/26/2015    REFERRING PROVIDER: Dr. Deward Curvin  REFERRING DIAG: Left breast cancer  THERAPY DIAG:  Malignant neoplasm of upper-inner quadrant of left breast in female, estrogen receptor positive (HCC)  Abnormal posture  At risk for lymphedema  Aftercare following  surgery for neoplasm  Rationale for Evaluation and Treatment: Rehabilitation  ONSET DATE: 07/20/23  SUBJECTIVE:                                                                                                                                                                                           SUBJECTIVE STATEMENT: I had my first radiation yesterday.  My arm is feeling pretty good.    PERTINENT HISTORY:  Post left lumpectomy with SLNB on 09/02/23 with 6 negative lymph nodes removed. Will have radiation.  Patient was diagnosed on 07/20/2023 with left grade 2 invasive ductal carcinoma breast  cancer. It measures 1.1 cm and is located in the upper inner quadrant. It is ER/PR positive and HER2 negative with a Ki67 of 1%. She reports that she has autism, diabetes and is on Ozempic for weight loss.   PATIENT GOALS:  Reassess how my recovery is going related to arm function, pain, and swelling.  PAIN:  Are you having pain? No  PRECAUTIONS: Recent Surgery, left UE Lymphedema risk  RED FLAGS: None   ACTIVITY LEVEL / LEISURE: I can't hold a jar like I usually do.     OBJECTIVE:   PATIENT SURVEYS:  QUICK DASH: 38% from 6%   OBSERVATIONS: Wearing a regular bra    POSTURE:  Rounded shoulders,  sitting with left shoulder elevated    LYMPHEDEMA ASSESSMENT:   UPPER EXTREMITY AROM/PROM:   A/PROM RIGHT   eval    Shoulder extension 60  Shoulder flexion 137  Shoulder abduction 152  Shoulder internal rotation 69  Shoulder external rotation 71                          (Blank rows = not tested)   A/PROM LEFT   eval 09/30/23 10/20/23  Shoulder extension 60 60 60  Shoulder flexion 118 110 increased burning and pain  / 125 with fungal infection noted in axilla - took photo and sent note to [name], NP 145  Shoulder abduction 152 98 with increased pain / 120 160  Shoulder internal rotation 50 50 50  Shoulder external rotation 58 80 80                          (Blank rows = not tested)   CERVICAL AROM: All within normal limits   UPPER EXTREMITY STRENGTH: WFL   LYMPHEDEMA ASSESSMENTS (in cm):    LANDMARK RIGHT   eval  10 cm proximal to olecranon process 37  Olecranon process 26.5  10 cm proximal to ulnar styloid process 22.4  Just proximal to ulnar styloid process 16  Across hand at thumb web space 18.3  At base of 2nd digit 6.3  (Blank rows = not tested)   LANDMARK LEFT   eval  10 cm proximal to olecranon process 37.9  Olecranon process 27.7  10 cm proximal to ulnar styloid process 22.9  Just proximal to ulnar styloid process 16.3  Across hand at thumb  web space 18.5  At base of 2nd digit 6.3  (Blank rows = not tested)  TODAYS TREATMENT 10/20/23: Discussed goals and final HEP  Therapeutic Exercises Pulleys into flex and abd (easy)  Roll ball up wall into flex and Lt UE abd (easy) Standing with back against wall (as much as able due to scoliosis) for bil UE 3 way raises with 2# x 10 each returning therapist demo for flex, scaption, and abd  Row yellow band x 12  10/15/23: Manual Therapy P/ROM to Lt shoulder into flex, abd and D2 with scapular depression by therapist, pt conts with mod tenderness near superior scap STM to Lt lateral trunk and pect insertion, then Rt S/L for STM to Lt UT gently and medial scap border with cocoa butter MFR to cording near axilla gently where seroma healing Therapeutic Exercises Pulleys into flex and abd x 2 mins, VC's to relax shoulder to decr Lt scap compensation Roll ball up wall into flex and Lt UE abd x 10 each  Standing with back against wall (as much as able due to scoliosis) for bil UE 3 way raises with 1# x 10 each returning therapist demo for flex, scaption, and abd with VC's for slower pace but returned good demo after that  PATIENT EDUCATION:  Education details: per today's note Person educated: Patient Education method: Programmer, multimedia, Demonstration, Tactile cues, Verbal cues, and Handouts Education comprehension: verbalized understanding and returned demonstration  HOME EXERCISE PROGRAM: Reviewed previously given post op HEP. Access Code: [email or web address] URL: [email or web address] Date: 09/30/2023 Prepared by: Saddie Raw  Exercises - Supine Shoulder Flexion Extension AAROM with Dowel  - 1-2 x daily - 7 x weekly - 10 reps - 5 seconds hold - Supine Chest Stretch with Elbows Bent  - 1-2 x daily - 7 x weekly - 1 sets - 10 reps - 5 sec hold - Seated Upper Trapezius Stretch  - 2 x daily - 7 x weekly - 3 reps - 30 seconds hold - Seated Scapular Retraction  - 1-3 x daily - 7 x weekly - 1  sets - 10 reps - 2-3 seconds hold - Standing Shoulder Abduction Finger Walk at Wall  - 1-2 x daily - 7 x weekly - 1 sets - 5 reps - 3 sec hold  ASSESSMENT:  CLINICAL IMPRESSION: Pt has now met all goals and is ready for DC with final HEP and lymphedema surveillance set up.   Pt will benefit from skilled therapeutic intervention to improve on the following deficits: Decreased knowledge of precautions, impaired UE functional use, pain, decreased ROM, postural dysfunction.   PT treatment/interventions: ADL/Self care home management, [phone]- PT Re-evaluation, 97110-Therapeutic exercises, 97530- Therapeutic activity, [id number]- Neuromuscular re-education, 97535- Self Care, and 02859- Manual therapy   GOALS: Goals reviewed with patient? Yes  LONG TERM GOALS:  (STG=LTG)  GOALS Name Target Date  Goal status  1 Pt will demonstrate she has regained full shoulder ROM and function post operatively compared to baselines.  Baseline: 10/28/23 MET  2 Pt will be ind with final HEP for radiation and after 10/28/23 MET  3  Pt will be educated on personal lymphedema risk and surveillance principles  10/28/23 MET  4 Pt will decrease QDASH to 10% or less 10/28/23 MOSTLY MET at 11%     PLAN:  PT FREQUENCY/DURATION: 1-2x per week x 3 weeks  PLAN FOR NEXT SESSION: Cont Lt shoulder PROM/AAROM, add neck stretches   [facility]  [address], [address]  [place] [address]  [phone]   Larue Saddie SAUNDERS, PT 10/20/2023, 11:38 AM  PHYSICAL THERAPY DISCHARGE SUMMARY  Visits from Start of Care: 6  Current functional level related to goals / functional outcomes: Ready for DC   Remaining deficits: Lymphedema risk    Education / Equipment: Final plan  Plan: Patient agrees to discharge.  Patient is being discharged due to meeting the stated rehab goals.

## 2023-10-21 ENCOUNTER — Other Ambulatory Visit: Payer: Self-pay

## 2023-10-21 ENCOUNTER — Ambulatory Visit
Admission: RE | Admit: 2023-10-21 | Discharge: 2023-10-21 | Disposition: A | Discharge status: Home / Self Care | Source: Ambulatory Visit | Attending: Radiation Oncology | Admitting: Radiation Oncology

## 2023-10-21 DIAGNOSIS — C50212 Malignant neoplasm of upper-inner quadrant of left female breast: Secondary | ICD-10-CM | POA: Diagnosis not present

## 2023-10-21 LAB — RAD ONC ARIA SESSION SUMMARY
Course Elapsed Days: 2
Plan Fractions Treated to Date: 3
Plan Prescribed Dose Per Fraction: 2.66 Gy
Plan Total Fractions Prescribed: 16
Plan Total Prescribed Dose: 42.56 Gy
Reference Point Dosage Given to Date: 7.98 Gy
Reference Point Session Dosage Given: 2.66 Gy
Session Number: 3

## 2023-10-22 ENCOUNTER — Other Ambulatory Visit: Payer: Self-pay

## 2023-10-22 ENCOUNTER — Ambulatory Visit: Admitting: Rehabilitation

## 2023-10-22 ENCOUNTER — Ambulatory Visit
Admission: RE | Admit: 2023-10-22 | Discharge: 2023-10-22 | Disposition: A | Discharge status: Home / Self Care | Source: Ambulatory Visit | Attending: Radiation Oncology | Admitting: Radiation Oncology

## 2023-10-22 DIAGNOSIS — C50212 Malignant neoplasm of upper-inner quadrant of left female breast: Secondary | ICD-10-CM | POA: Diagnosis not present

## 2023-10-22 LAB — RAD ONC ARIA SESSION SUMMARY
Course Elapsed Days: 3
Plan Fractions Treated to Date: 4
Plan Prescribed Dose Per Fraction: 2.66 Gy
Plan Total Fractions Prescribed: 16
Plan Total Prescribed Dose: 42.56 Gy
Reference Point Dosage Given to Date: 10.64 Gy
Reference Point Session Dosage Given: 2.66 Gy
Session Number: 4

## 2023-10-23 ENCOUNTER — Ambulatory Visit
Admission: RE | Admit: 2023-10-23 | Discharge: 2023-10-23 | Disposition: A | Discharge status: Home / Self Care | Source: Ambulatory Visit | Attending: Radiation Oncology | Admitting: Radiation Oncology

## 2023-10-23 ENCOUNTER — Other Ambulatory Visit: Payer: Self-pay

## 2023-10-23 DIAGNOSIS — C50212 Malignant neoplasm of upper-inner quadrant of left female breast: Secondary | ICD-10-CM

## 2023-10-23 LAB — RAD ONC ARIA SESSION SUMMARY
Course Elapsed Days: 4
Plan Fractions Treated to Date: 5
Plan Prescribed Dose Per Fraction: 2.66 Gy
Plan Total Fractions Prescribed: 16
Plan Total Prescribed Dose: 42.56 Gy
Reference Point Dosage Given to Date: 13.3 Gy
Reference Point Session Dosage Given: 2.66 Gy
Session Number: 5

## 2023-10-23 MED ORDER — RADIAPLEXRX EX GEL
Freq: Once | CUTANEOUS | Status: AC
  Administered 2023-10-23: 1 via TOPICAL

## 2023-10-23 MED ORDER — ALRA NON-METALLIC DEODORANT (RAD-ONC)
1.0000 | Freq: Once | TOPICAL | Status: AC
  Administered 2023-10-23: 1 via TOPICAL

## 2023-10-26 ENCOUNTER — Ambulatory Visit
Admission: RE | Admit: 2023-10-26 | Discharge: 2023-10-26 | Disposition: A | Discharge status: Home / Self Care | Source: Ambulatory Visit | Attending: Radiation Oncology

## 2023-10-26 ENCOUNTER — Other Ambulatory Visit: Payer: Self-pay

## 2023-10-26 DIAGNOSIS — C50212 Malignant neoplasm of upper-inner quadrant of left female breast: Secondary | ICD-10-CM | POA: Diagnosis not present

## 2023-10-26 LAB — RAD ONC ARIA SESSION SUMMARY
Course Elapsed Days: 7
Plan Fractions Treated to Date: 6
Plan Prescribed Dose Per Fraction: 2.66 Gy
Plan Total Fractions Prescribed: 16
Plan Total Prescribed Dose: 42.56 Gy
Reference Point Dosage Given to Date: 15.96 Gy
Reference Point Session Dosage Given: 2.66 Gy
Session Number: 6

## 2023-10-27 ENCOUNTER — Other Ambulatory Visit: Payer: Self-pay

## 2023-10-27 ENCOUNTER — Ambulatory Visit
Admission: RE | Admit: 2023-10-27 | Discharge: 2023-10-27 | Disposition: A | Discharge status: Home / Self Care | Source: Ambulatory Visit | Attending: Radiation Oncology | Admitting: Radiation Oncology

## 2023-10-27 DIAGNOSIS — C50212 Malignant neoplasm of upper-inner quadrant of left female breast: Secondary | ICD-10-CM | POA: Diagnosis not present

## 2023-10-27 LAB — RAD ONC ARIA SESSION SUMMARY
Course Elapsed Days: 8
Plan Fractions Treated to Date: 7
Plan Prescribed Dose Per Fraction: 2.66 Gy
Plan Total Fractions Prescribed: 16
Plan Total Prescribed Dose: 42.56 Gy
Reference Point Dosage Given to Date: 18.62 Gy
Reference Point Session Dosage Given: 2.66 Gy
Session Number: 7

## 2023-10-28 ENCOUNTER — Ambulatory Visit
Admission: RE | Admit: 2023-10-28 | Discharge: 2023-10-28 | Disposition: A | Discharge status: Home / Self Care | Source: Ambulatory Visit | Attending: Radiation Oncology | Admitting: Radiation Oncology

## 2023-10-28 ENCOUNTER — Other Ambulatory Visit: Payer: Self-pay

## 2023-10-28 DIAGNOSIS — C50212 Malignant neoplasm of upper-inner quadrant of left female breast: Secondary | ICD-10-CM | POA: Diagnosis not present

## 2023-10-28 LAB — RAD ONC ARIA SESSION SUMMARY
Course Elapsed Days: 9
Plan Fractions Treated to Date: 8
Plan Prescribed Dose Per Fraction: 2.66 Gy
Plan Total Fractions Prescribed: 16
Plan Total Prescribed Dose: 42.56 Gy
Reference Point Dosage Given to Date: 21.28 Gy
Reference Point Session Dosage Given: 2.66 Gy
Session Number: 8

## 2023-10-29 ENCOUNTER — Other Ambulatory Visit: Payer: Self-pay

## 2023-10-29 ENCOUNTER — Ambulatory Visit
Admission: RE | Admit: 2023-10-29 | Discharge: 2023-10-29 | Disposition: A | Discharge status: Home / Self Care | Source: Ambulatory Visit | Attending: Radiation Oncology | Admitting: Radiation Oncology

## 2023-10-29 DIAGNOSIS — C50212 Malignant neoplasm of upper-inner quadrant of left female breast: Secondary | ICD-10-CM | POA: Diagnosis not present

## 2023-10-29 LAB — RAD ONC ARIA SESSION SUMMARY
Course Elapsed Days: 10
Plan Fractions Treated to Date: 9
Plan Prescribed Dose Per Fraction: 2.66 Gy
Plan Total Fractions Prescribed: 16
Plan Total Prescribed Dose: 42.56 Gy
Reference Point Dosage Given to Date: 23.94 Gy
Reference Point Session Dosage Given: 2.66 Gy
Session Number: 9

## 2023-10-30 ENCOUNTER — Other Ambulatory Visit: Payer: Self-pay

## 2023-10-30 ENCOUNTER — Ambulatory Visit
Admission: RE | Admit: 2023-10-30 | Discharge: 2023-10-30 | Disposition: A | Discharge status: Home / Self Care | Source: Ambulatory Visit | Attending: Radiation Oncology | Admitting: Radiation Oncology

## 2023-10-30 DIAGNOSIS — C50212 Malignant neoplasm of upper-inner quadrant of left female breast: Secondary | ICD-10-CM | POA: Diagnosis not present

## 2023-10-30 LAB — RAD ONC ARIA SESSION SUMMARY
Course Elapsed Days: 11
Plan Fractions Treated to Date: 10
Plan Prescribed Dose Per Fraction: 2.66 Gy
Plan Total Fractions Prescribed: 16
Plan Total Prescribed Dose: 42.56 Gy
Reference Point Dosage Given to Date: 26.6 Gy
Reference Point Session Dosage Given: 2.66 Gy
Session Number: 10

## 2023-11-02 ENCOUNTER — Ambulatory Visit
Admission: RE | Admit: 2023-11-02 | Discharge: 2023-11-02 | Disposition: A | Discharge status: Home / Self Care | Source: Ambulatory Visit | Attending: Radiation Oncology

## 2023-11-02 ENCOUNTER — Other Ambulatory Visit: Payer: Self-pay

## 2023-11-02 DIAGNOSIS — C50212 Malignant neoplasm of upper-inner quadrant of left female breast: Secondary | ICD-10-CM | POA: Diagnosis not present

## 2023-11-02 LAB — RAD ONC ARIA SESSION SUMMARY
Course Elapsed Days: 14
Plan Fractions Treated to Date: 11
Plan Prescribed Dose Per Fraction: 2.66 Gy
Plan Total Fractions Prescribed: 16
Plan Total Prescribed Dose: 42.56 Gy
Reference Point Dosage Given to Date: 29.26 Gy
Reference Point Session Dosage Given: 2.66 Gy
Session Number: 11

## 2023-11-03 ENCOUNTER — Other Ambulatory Visit: Payer: Self-pay

## 2023-11-03 ENCOUNTER — Ambulatory Visit
Admission: RE | Admit: 2023-11-03 | Discharge: 2023-11-03 | Disposition: A | Discharge status: Home / Self Care | Source: Ambulatory Visit | Attending: Radiation Oncology | Admitting: Radiation Oncology

## 2023-11-03 DIAGNOSIS — C50212 Malignant neoplasm of upper-inner quadrant of left female breast: Secondary | ICD-10-CM | POA: Diagnosis not present

## 2023-11-03 LAB — RAD ONC ARIA SESSION SUMMARY
Course Elapsed Days: 15
Plan Fractions Treated to Date: 12
Plan Prescribed Dose Per Fraction: 2.66 Gy
Plan Total Fractions Prescribed: 16
Plan Total Prescribed Dose: 42.56 Gy
Reference Point Dosage Given to Date: 31.92 Gy
Reference Point Session Dosage Given: 2.66 Gy
Session Number: 12

## 2023-11-04 ENCOUNTER — Other Ambulatory Visit: Payer: Self-pay

## 2023-11-04 ENCOUNTER — Ambulatory Visit
Admission: RE | Admit: 2023-11-04 | Discharge: 2023-11-04 | Disposition: A | Discharge status: Home / Self Care | Source: Ambulatory Visit | Attending: Radiation Oncology | Admitting: Radiation Oncology

## 2023-11-04 DIAGNOSIS — C50212 Malignant neoplasm of upper-inner quadrant of left female breast: Secondary | ICD-10-CM | POA: Diagnosis not present

## 2023-11-04 LAB — RAD ONC ARIA SESSION SUMMARY
Course Elapsed Days: 16
Plan Fractions Treated to Date: 13
Plan Prescribed Dose Per Fraction: 2.66 Gy
Plan Total Fractions Prescribed: 16
Plan Total Prescribed Dose: 42.56 Gy
Reference Point Dosage Given to Date: 34.58 Gy
Reference Point Session Dosage Given: 2.66 Gy
Session Number: 13

## 2023-11-05 ENCOUNTER — Other Ambulatory Visit: Payer: Self-pay

## 2023-11-05 ENCOUNTER — Ambulatory Visit
Admission: RE | Admit: 2023-11-05 | Discharge: 2023-11-05 | Disposition: A | Discharge status: Home / Self Care | Source: Ambulatory Visit | Attending: Radiation Oncology | Admitting: Radiation Oncology

## 2023-11-05 DIAGNOSIS — C50212 Malignant neoplasm of upper-inner quadrant of left female breast: Secondary | ICD-10-CM | POA: Diagnosis not present

## 2023-11-05 LAB — RAD ONC ARIA SESSION SUMMARY
Course Elapsed Days: 17
Plan Fractions Treated to Date: 14
Plan Prescribed Dose Per Fraction: 2.66 Gy
Plan Total Fractions Prescribed: 16
Plan Total Prescribed Dose: 42.56 Gy
Reference Point Dosage Given to Date: 37.24 Gy
Reference Point Session Dosage Given: 2.66 Gy
Session Number: 14

## 2023-11-06 ENCOUNTER — Ambulatory Visit: Admitting: Radiation Oncology

## 2023-11-06 ENCOUNTER — Ambulatory Visit
Admission: RE | Admit: 2023-11-06 | Discharge: 2023-11-06 | Disposition: A | Discharge status: Home / Self Care | Source: Ambulatory Visit | Attending: Radiation Oncology | Admitting: Radiation Oncology

## 2023-11-06 ENCOUNTER — Other Ambulatory Visit: Payer: Self-pay

## 2023-11-06 DIAGNOSIS — C50212 Malignant neoplasm of upper-inner quadrant of left female breast: Secondary | ICD-10-CM | POA: Diagnosis not present

## 2023-11-06 LAB — RAD ONC ARIA SESSION SUMMARY
Course Elapsed Days: 18
Plan Fractions Treated to Date: 15
Plan Prescribed Dose Per Fraction: 2.66 Gy
Plan Total Fractions Prescribed: 16
Plan Total Prescribed Dose: 42.56 Gy
Reference Point Dosage Given to Date: 39.9 Gy
Reference Point Session Dosage Given: 2.66 Gy
Session Number: 15

## 2023-11-09 ENCOUNTER — Other Ambulatory Visit: Payer: Self-pay

## 2023-11-09 ENCOUNTER — Ambulatory Visit
Admission: RE | Admit: 2023-11-09 | Discharge: 2023-11-09 | Disposition: A | Discharge status: Home / Self Care | Source: Ambulatory Visit | Attending: Radiation Oncology

## 2023-11-09 DIAGNOSIS — C50212 Malignant neoplasm of upper-inner quadrant of left female breast: Secondary | ICD-10-CM | POA: Diagnosis not present

## 2023-11-09 LAB — RAD ONC ARIA SESSION SUMMARY
Course Elapsed Days: 21
Plan Fractions Treated to Date: 16
Plan Prescribed Dose Per Fraction: 2.66 Gy
Plan Total Fractions Prescribed: 16
Plan Total Prescribed Dose: 42.56 Gy
Reference Point Dosage Given to Date: 42.56 Gy
Reference Point Session Dosage Given: 2.66 Gy
Session Number: 16

## 2023-11-10 ENCOUNTER — Ambulatory Visit
Admission: RE | Admit: 2023-11-10 | Discharge: 2023-11-10 | Disposition: A | Discharge status: Home / Self Care | Source: Ambulatory Visit | Attending: Radiation Oncology

## 2023-11-10 ENCOUNTER — Other Ambulatory Visit: Payer: Self-pay

## 2023-11-10 ENCOUNTER — Inpatient Hospital Stay: Attending: Hematology | Admitting: Hematology

## 2023-11-10 VITALS — BP 98/72 | HR 72 | Temp 98.0°F | Resp 18 | Ht 64.0 in | Wt 247.0 lb

## 2023-11-10 DIAGNOSIS — Z170 Estrogen receptor positive status [ER+]: Secondary | ICD-10-CM | POA: Diagnosis not present

## 2023-11-10 DIAGNOSIS — C50212 Malignant neoplasm of upper-inner quadrant of left female breast: Secondary | ICD-10-CM

## 2023-11-10 LAB — RAD ONC ARIA SESSION SUMMARY
Course Elapsed Days: 22
Plan Fractions Treated to Date: 1
Plan Prescribed Dose Per Fraction: 2 Gy
Plan Total Fractions Prescribed: 4
Plan Total Prescribed Dose: 8 Gy
Reference Point Dosage Given to Date: 2 Gy
Reference Point Session Dosage Given: 2 Gy
Session Number: 17

## 2023-11-10 MED ORDER — ANASTROZOLE 1 MG PO TABS: 1.0000 mg | ORAL_TABLET | Freq: Every day | ORAL | 3 refills | Status: DC

## 2023-11-10 NOTE — Progress Notes (Signed)
 [facility]   [phone] [phone]   Clinic Follow up Note   Patient Care Team: Signa Rush, MD (Inactive) as PCP - General (Internal Medicine) Glean Stephane BROCKS, RN (Inactive) as Oncology Nurse Navigator Tyree Nanetta SAILOR, RN as Oncology Nurse Navigator Curvin Deward MOULD, MD as Consulting Physician (General Surgery) Lanny Callander, MD as Consulting Physician (Hematology) Dewey Rush, MD as Consulting Physician (Radiation Oncology)  Date of Service:  11/10/2023  CHIEF COMPLAINT: f/u of left breast cancer  CURRENT THERAPY:  Adjuvant radiation  Oncology History   Malignant neoplasm of upper-inner quadrant of left breast in female, 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 - Status post lumpectomy and sentinel lymph node biopsy on October 05, 2023.  Surgical path showed a 1.4 cm invasive ductal carcinoma, and DCIS.  Sentinel lymph nodes and margins were negative. -Oncotype recurrence score 9, adjuvant chemotherapy is not recommended. -Status post adjuvant radiation -I recommend adjuvant AI  Assessment & Plan Breast cancer, status post surgery and radiation Breast cancer with low recurrence risk as indicated by an Oncotype DX score of 9. Completed surgery and nearing completion of radiation therapy with side effects including sharp pain and seroma leakage. Recommended adjuvant therapy with anastrozole , an aromatase inhibitor, to reduce recurrence risk. Discussed common side effects of anastrozole , including hot flashes, joint pain, and potential bone density loss. Emphasized monitoring cholesterol, blood sugar, blood pressure, and weight due to potential metabolic effects of anastrozole . - Prescribe anastrozole  and call prescription into [facility] in [facility]. - Advise to start anastrozole  in mid to late September after recovery from radiation. - Schedule  survivorship visit with nurse practitioner Manuelita. - Schedule follow-up appointment in six months.  Seroma at surgical site, resolving Seroma at the surgical site with leakage and irritation, likely exacerbated by radiation therapy. No signs of infection. The seroma has significantly reduced in size and is resolving. - Advise to keep the area dry and avoid irritation.  Osteopenia Osteopenia confirmed by recent bone density scan. Discussed the potential impact of anastrozole  on bone density and the need for regular monitoring. - Check bone density every two years.  Type 2 diabetes mellitus Type 2 diabetes mellitus managed with medication and lifestyle changes. Emphasized the importance of diet, particularly reducing carbohydrates and increasing vegetables and lean meats. Noted significant weight loss from 309 pounds, indicating successful management efforts. - Continue current diabetes management plan, including medication and dietary modifications.  Arthralgia Moderate arthralgia, particularly in hips and knees, with potential exacerbation from anastrozole . Discussed the possibility of joint pain as a side effect of anastrozole , affecting up to 40% of patients. Recommended exercise to manage symptoms. - Monitor for joint pain and stiffness. - Advise exercise to alleviate joint symptoms. - Consider alternative medication if joint pain becomes severe.  Plan - She is overall tolerating radiation well, will complete that this Friday. - I recommend adjuvant anastrozole  for 5 to 7 years, she agrees.  I called in prescription today, she will start in 3 to 4 weeks. - Survivorship in 3 months, lab and follow-up with me in 6 months.   SUMMARY OF ONCOLOGIC HISTORY: Oncology History  Malignant neoplasm of upper-inner quadrant of left breast in female, estrogen receptor positive (HCC)  08/07/2023 Cancer Staging   Staging form: Breast, AJCC 8th Edition - Clinical stage from 08/07/2023: Stage IA  (cT1c, cN0, cM0, G2, ER+, PR+, HER2-) - Signed by Lanny Callander,  MD on 08/18/2023 Stage prefix: Initial diagnosis Histologic grading system: 3 grade system   08/17/2023 Initial Diagnosis   Malignant neoplasm of upper-inner quadrant of left breast in female, estrogen receptor positive (HCC)   08/31/2023 Genetic Testing   Negative genetic testing on the CancerNext+RNAinsight panel.  The report date is August 29, 2023.  The [facility] CancerNext+RNAinsight Panel includes sequencing, rearrangement analysis, and RNA analysis for the following 40 genes: APC, ATM, BAP1, BARD1, BMPR1A, BRCA1, BRCA2, BRIP1, CDH1, CDKN2A, CHEK2, FH, FLCN, MET, MLH1, MSH2, MSH6, MUTYH, NF1, NTHL1, PALB2, PMS2, PTEN, RAD51C, RAD51D, RPS20, SMAD4, STK11, TP53, TSC1, TSC2 and VHL (sequencing and deletion/duplication); AXIN2, HOXB13, MBD4, MSH3, POLD1 and POLE (sequencing only); EPCAM and GREM1 (deletion/duplication only). RNA data is routinely analyzed for use in variant interpretation for all genes.    10/05/2023 Cancer Staging   Staging form: Breast, AJCC 8th Edition - Pathologic stage from 10/05/2023: Stage IA (pT1c, pN0(sn), cM0, G2, ER+, PR+, HER2-, Oncotype DX score: 9) - Signed by Lanell Donald Stagger, PA-C on 10/05/2023 Stage prefix: Initial diagnosis Method of lymph node assessment: Sentinel lymph node biopsy Multigene prognostic tests performed: Oncotype DX Recurrence score range: Less than 11 Histologic grading system: 3 grade system      Discussed the use of AI scribe software for clinical note transcription with the patient, who gave verbal consent to proceed.  History of Present Illness Tara Johnson is a 63 year old female with breast cancer who presents for follow-up.  She is undergoing radiation therapy for breast cancer and experiences side effects, including a sensation of bruising or sharp pain in her breast. There is leakage from a seroma under a previous incision site, which is intermittently moist and  occasionally drains significantly.  Her family history includes a cousin with stage four lung cancer and multiple relatives on her mother's side with ovarian cancer. Genetic counseling did not reveal any hereditary cancer syndromes.  Current medications include albuterol, amlodipine, meflonate, glimepiride, xantharide, lisinopril, robloxin, robustatin, Ozempic, and vitamin D. She manages diabetes with dietary modifications and has lost significant weight from a previous high of 309 pounds.     All other systems were reviewed with the patient and are negative.  MEDICAL HISTORY:  Past Medical History:  Diagnosis Date   Arthritis    lower back and bilateral knees   Asperger's syndrome    High functioning   Asthma    associated with upper respiratory infection use inhaler as needed   Autism    high functioning   Breast cancer (HCC) 07/2023   left breast IDC   Cancer (HCC)    precancerous lesions on skin 20 plus   Chronic back pain    Closed fracture of right distal radius    Complication of anesthesia    prolonged sedation   Diabetes mellitus without complication (HCC)    Diabetic neuropathy (HCC)    Family history of breast cancer    Family history of ovarian cancer    Hip pain    Hypertension    Hypothyroidism    Osteopenia    Thyroid  disease    Vitamin D deficiency     SURGICAL HISTORY: Past Surgical History:  Procedure Laterality Date   BREAST BIOPSY     years ago unsure of side   BREAST BIOPSY Left 08/07/2023   US  LT BREAST BX W LOC DEV 1ST LESION IMG BX SPEC US  GUIDE 08/07/2023 [facility]   BREAST BIOPSY Left 09/01/2023   US  LT RADIOACTIVE SEED  LOC 09/01/2023 [facility]   BREAST LUMPECTOMY WITH RADIOACTIVE SEED AND SENTINEL LYMPH NODE BIOPSY Left 09/02/2023   Procedure: BREAST LUMPECTOMY WITH RADIOACTIVE SEED AND SENTINEL LYMPH NODE BIOPSY;  Surgeon: Curvin Deward MOULD, MD;  Location: [facility];  Service: General;  Laterality: Left;  GEN  w/PEC BLOCK LEFT BREAST RADIOACTIVE SEED LOCALIZED LUMPECTOMY SENTINEL NODE BIOPSY   CHOLECYSTECTOMY     DILATATION & CURETTAGE/HYSTEROSCOPY WITH MYOSURE N/A 09/26/2015   Procedure: DILATATION & CURETTAGE/HYSTEROSCOPY WITH MYOSURE;  Surgeon: Rexene Hoit, MD;  Location: [facility];  Service: Gynecology;  Laterality: N/A;  Polypectomy   DILATATION & CURETTAGE/HYSTEROSCOPY WITH MYOSURE N/A 10/12/2019   Procedure: DILATATION & CURETTAGE/HYSTEROSCOPY, POLYPECTOMY WITH MYOSURE;  Surgeon: Hoit Rexene, MD;  Location: [facility];  Service: Gynecology;  Laterality: N/A;   OPEN REDUCTION INTERNAL FIXATION (ORIF) DISTAL RADIAL FRACTURE Right 04/19/2018   Procedure: OPEN TREATMENT OF RIGHT DISTAL RADIUS;  Surgeon: Sebastian Lenis, MD;  Location: [facility];  Service: Orthopedics;  Laterality: Right;    I have reviewed the social history and family history with the patient and they are unchanged from previous note.  ALLERGIES:  is allergic to metformin and related.  MEDICATIONS:  Current Outpatient Medications  Medication Sig Dispense Refill   anastrozole  (ARIMIDEX ) 1 MG tablet Take 1 tablet (1 mg total) by mouth daily. 30 tablet 3   albuterol (PROVENTIL HFA;VENTOLIN HFA) 108 (90 Base) MCG/ACT inhaler Inhale into the lungs every 6 (six) hours as needed for wheezing or shortness of breath.     amLODipine (NORVASC) 5 MG tablet Take 5 mg by mouth daily.     Fluticasone Furoate (ARNUITY ELLIPTA) 100 MCG/ACT AEPB Inhale 1 puff into the lungs daily.     glimepiride (AMARYL) 4 MG tablet Take 4 mg by mouth daily with breakfast.     glucose blood test strip 1 each by Other route as needed for other. Use as instructed     levothyroxine (SYNTHROID) 112 MCG tablet Take 112 mcg by mouth daily.     lisinopril (PRINIVIL,ZESTRIL) 20 MG tablet Take 20 mg by mouth daily.     methocarbamol (ROBAXIN) 500 MG tablet Take 500 mg by mouth every 6 (six) hours as needed for muscle spasms.     nystatin   (MYCOSTATIN /NYSTOP ) powder Apply 1 Application topically 2 (two) times daily. To axillary skin rashes until rashes clear 60 g 2   rosuvastatin (CRESTOR) 10 MG tablet Take 10 mg by mouth daily.     Semaglutide (OZEMPIC, 2 MG/DOSE, [place]) Inject 2 mg into the skin once a week.     Vitamin D, Ergocalciferol, (DRISDOL) 1.25 MG (50000 UNIT) CAPS capsule Take 50,000 Units by mouth every 7 (seven) days.     No current facility-administered medications for this visit.    PHYSICAL EXAMINATION: ECOG PERFORMANCE STATUS: 1 - Symptomatic but completely ambulatory  Vitals:   11/10/23 1433  BP: 98/72  Pulse: 72  Resp: 18  Temp: 98 F (36.7 C)  SpO2: 96%   Wt Readings from Last 3 Encounters:  11/10/23 247 lb (112 kg)  10/07/23 247 lb 6.4 oz (112.2 kg)  09/02/23 244 lb 0.8 oz (110.7 kg)     GENERAL:alert, no distress and comfortable SKIN: skin color, texture, turgor are normal, no rashes or significant lesions EYES: normal, Conjunctiva are pink and non-injected, sclera clear NECK: supple, thyroid  normal size, non-tender, without nodularity LYMPH:  no palpable lymphadenopathy in the cervical, axillary  LUNGS: clear to auscultation and percussion with  normal breathing effort HEART: regular rate & rhythm and no murmurs and no lower extremity edema ABDOMEN:abdomen soft, non-tender and normal bowel sounds Musculoskeletal:no cyanosis of digits and no clubbing  NEURO: alert & oriented x 3 with fluent speech, no focal motor/sensory deficits BREAST: Left breast has diffuse skin erythema from radiation, surgical incision has healed with skin retraction.  No palpable mass in the left breast. Physical Exam   LABORATORY DATA:  I have reviewed the data as listed    Latest Ref Rng & Units 08/19/2023   11:54 AM 10/12/2019   12:07 PM 04/04/2018    1:34 PM  CBC  WBC 4.0 - 10.5 K/uL 6.3  6.4  7.8   Hemoglobin 12.0 - 15.0 g/dL 85.1  85.0  84.7   Hematocrit 36.0 - 46.0 % 44.0  45.1  47.0   Platelets 150 - 400  K/uL 282  267  242         Latest Ref Rng & Units 08/19/2023   11:54 AM 10/10/2019   11:55 AM 04/04/2018    1:34 PM  CMP  Glucose 70 - 99 mg/dL 863  877  791   BUN 8 - 23 mg/dL 15  13  11    Creatinine 0.44 - 1.00 mg/dL 9.16  9.00  9.20   Sodium 135 - 145 mmol/L 141  138  137   Potassium 3.5 - 5.1 mmol/L 4.7  4.2  3.9   Chloride 98 - 111 mmol/L 106  104  106   CO2 22 - 32 mmol/L 30  26  23    Calcium 8.9 - 10.3 mg/dL 9.7  9.5  8.9   Total Protein 6.5 - 8.1 g/dL 7.4   7.3   Total Bilirubin 0.0 - 1.2 mg/dL 0.5   0.4   Alkaline Phos 38 - 126 U/L 65   67   AST 15 - 41 U/L 14   29   ALT 0 - 44 U/L 11   26       RADIOGRAPHIC STUDIES: I have personally reviewed the radiological images as listed and agreed with the findings in the report. No results found.    No orders of the defined types were placed in this encounter.  All questions were answered. The patient knows to call the clinic with any problems, questions or concerns. No barriers to learning was detected. The total time spent in the appointment was 25 minutes, including review of chart and various tests results, discussions about plan of care and coordination of care plan     Onita Mattock, MD 11/10/2023

## 2023-11-10 NOTE — Assessment & Plan Note (Signed)
 pT1cN0M0 stage IA, ER+/PR/HER2-, G2, 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 - Status post lumpectomy and sentinel lymph node biopsy on October 05, 2023.  Surgical path showed a 1.4 cm invasive ductal carcinoma, and DCIS.  Sentinel lymph nodes and margins were negative. -Oncotype recurrence score 9, adjuvant chemotherapy is not recommended. -Status post adjuvant radiation -I recommend adjuvant AI

## 2023-11-11 ENCOUNTER — Ambulatory Visit
Admission: RE | Admit: 2023-11-11 | Discharge: 2023-11-11 | Disposition: A | Discharge status: Home / Self Care | Source: Ambulatory Visit | Attending: Radiation Oncology

## 2023-11-11 ENCOUNTER — Other Ambulatory Visit: Payer: Self-pay

## 2023-11-11 ENCOUNTER — Telehealth: Payer: Self-pay

## 2023-11-11 DIAGNOSIS — C50212 Malignant neoplasm of upper-inner quadrant of left female breast: Secondary | ICD-10-CM | POA: Diagnosis not present

## 2023-11-11 LAB — RAD ONC ARIA SESSION SUMMARY
Course Elapsed Days: 23
Plan Fractions Treated to Date: 2
Plan Prescribed Dose Per Fraction: 2 Gy
Plan Total Fractions Prescribed: 4
Plan Total Prescribed Dose: 8 Gy
Reference Point Dosage Given to Date: 4 Gy
Reference Point Session Dosage Given: 2 Gy
Session Number: 18

## 2023-11-11 NOTE — Telephone Encounter (Signed)
 Reached out to [facility] @ [place] T# [phone] to request patient's Dexa report from PCP, per Dr. Lanny.  Spoke with Therisa. Therisa agreed to fax the report to Dr. Lanny F# [phone]. Awaiting fax.

## 2023-11-12 ENCOUNTER — Other Ambulatory Visit: Payer: Self-pay

## 2023-11-12 ENCOUNTER — Ambulatory Visit
Admission: RE | Admit: 2023-11-12 | Discharge: 2023-11-12 | Disposition: A | Discharge status: Home / Self Care | Source: Ambulatory Visit | Attending: Radiation Oncology

## 2023-11-12 DIAGNOSIS — C50212 Malignant neoplasm of upper-inner quadrant of left female breast: Secondary | ICD-10-CM | POA: Diagnosis not present

## 2023-11-12 LAB — RAD ONC ARIA SESSION SUMMARY
Course Elapsed Days: 24
Plan Fractions Treated to Date: 3
Plan Prescribed Dose Per Fraction: 2 Gy
Plan Total Fractions Prescribed: 4
Plan Total Prescribed Dose: 8 Gy
Reference Point Dosage Given to Date: 6 Gy
Reference Point Session Dosage Given: 2 Gy
Session Number: 19

## 2023-11-13 ENCOUNTER — Ambulatory Visit
Admission: RE | Admit: 2023-11-13 | Discharge: 2023-11-13 | Disposition: A | Discharge status: Home / Self Care | Source: Ambulatory Visit | Attending: Radiation Oncology | Admitting: Radiation Oncology

## 2023-11-13 ENCOUNTER — Encounter: Payer: Self-pay | Admitting: Internal Medicine

## 2023-11-13 ENCOUNTER — Other Ambulatory Visit: Payer: Self-pay

## 2023-11-13 DIAGNOSIS — C50212 Malignant neoplasm of upper-inner quadrant of left female breast: Secondary | ICD-10-CM | POA: Diagnosis not present

## 2023-11-13 LAB — RAD ONC ARIA SESSION SUMMARY
Course Elapsed Days: 25
Plan Fractions Treated to Date: 4
Plan Prescribed Dose Per Fraction: 2 Gy
Plan Total Fractions Prescribed: 4
Plan Total Prescribed Dose: 8 Gy
Reference Point Dosage Given to Date: 8 Gy
Reference Point Session Dosage Given: 2 Gy
Session Number: 20

## 2023-11-18 ENCOUNTER — Telehealth: Payer: Self-pay

## 2023-11-18 NOTE — Telephone Encounter (Signed)
 Notified the pt regarding her FMLA forms being completed,faxed, and confirmation received. Pt verbalized understanding. Pt copy was mailed to her address as requested. No questions or concerns to be noted at this time.

## 2023-11-18 NOTE — Radiation Completion Notes (Addendum)
  Radiation Oncology         [phone] ________________________________  Name: Tara Johnson MRN: [id number]  Date of Service: 11/13/2023  DOB: Jun 11, 1960  End of Treatment Note  Diagnosis: Stage IA, pT1cN0M0, grade 2, ER/PR positive invasive ductal carcinoma of the left breast   Intent: Curative     ==========DELIVERED PLANS==========  First Treatment Date: 2023-10-19 Last Treatment Date: 2023-11-13   Plan Name: Breast_L Site: Breast, Left Technique: 3D Mode: Photon Dose Per Fraction: 2.66 Gy Prescribed Dose (Delivered / Prescribed): 42.56 Gy / 42.56 Gy Prescribed Fxs (Delivered / Prescribed): 16 / 16   Plan Name: Breast_L_Bst Site: Breast, Left Technique: 3D Mode: Photon Dose Per Fraction: 2 Gy Prescribed Dose (Delivered / Prescribed): 8 Gy / 8 Gy Prescribed Fxs (Delivered / Prescribed): 4 / 4     ==========ON TREATMENT VISIT DATES========== 2023-10-23, 2023-10-30, 2023-11-06, 2023-11-13    See weekly On Treatment Notes in Epic for details in the Media tab (listed as Progress notes on the On Treatment Visit Dates listed above). The patient tolerated radiation. She developed fatigue and anticipated skin changes in the treatment field.   The patient will receive a call in about one month from the radiation oncology department. She will continue follow up with Dr. Lanny as well.      Donald KYM Husband, PAC

## 2023-11-23 ENCOUNTER — Ambulatory Visit: Attending: General Surgery

## 2023-11-23 VITALS — Wt 245.0 lb

## 2023-11-23 DIAGNOSIS — Z483 Aftercare following surgery for neoplasm: Secondary | ICD-10-CM | POA: Insufficient documentation

## 2023-11-23 NOTE — Therapy (Signed)
 OUTPATIENT PHYSICAL THERAPY SOZO SCREENING NOTE   Patient Name: Tara Johnson MRN: [id number] DOB:11-Jul-1960, 63 y.o., female Today's Date: 11/23/2023  PCP: Signa Rush, MD (Inactive) REFERRING PROVIDER: Curvin Deward MOULD, MD   PT End of Session - 11/23/23 1051     Visit Number 7   # unchanged due to screen only   PT Start Time 1049    PT Stop Time 1053    PT Time Calculation (min) 4 min    Activity Tolerance Patient tolerated treatment well    Behavior During Therapy WFL for tasks assessed/performed          Past Medical History:  Diagnosis Date   Arthritis    lower back and bilateral knees   Asperger's syndrome    High functioning   Asthma    associated with upper respiratory infection use inhaler as needed   Autism    high functioning   Breast cancer (HCC) 07/2023   left breast IDC   Cancer (HCC)    precancerous lesions on skin 20 plus   Chronic back pain    Closed fracture of right distal radius    Complication of anesthesia    prolonged sedation   Diabetes mellitus without complication (HCC)    Diabetic neuropathy (HCC)    Family history of breast cancer    Family history of ovarian cancer    Hip pain    Hypertension    Hypothyroidism    Osteopenia    Thyroid  disease    Vitamin D deficiency    Past Surgical History:  Procedure Laterality Date   BREAST BIOPSY     years ago unsure of side   BREAST BIOPSY Left 08/07/2023   US  LT BREAST BX W LOC DEV 1ST LESION IMG BX SPEC US  GUIDE 08/07/2023 [facility]   BREAST BIOPSY Left 09/01/2023   US  LT RADIOACTIVE SEED LOC 09/01/2023 [facility]   BREAST LUMPECTOMY WITH RADIOACTIVE SEED AND SENTINEL LYMPH NODE BIOPSY Left 09/02/2023   Procedure: BREAST LUMPECTOMY WITH RADIOACTIVE SEED AND SENTINEL LYMPH NODE BIOPSY;  Surgeon: Curvin Deward MOULD, MD;  Location: [facility];  Service: General;  Laterality: Left;  GEN w/PEC BLOCK LEFT BREAST RADIOACTIVE SEED LOCALIZED LUMPECTOMY SENTINEL NODE BIOPSY    CHOLECYSTECTOMY     DILATATION & CURETTAGE/HYSTEROSCOPY WITH MYOSURE N/A 09/26/2015   Procedure: DILATATION & CURETTAGE/HYSTEROSCOPY WITH MYOSURE;  Surgeon: Rexene Hoit, MD;  Location: [facility];  Service: Gynecology;  Laterality: N/A;  Polypectomy   DILATATION & CURETTAGE/HYSTEROSCOPY WITH MYOSURE N/A 10/12/2019   Procedure: DILATATION & CURETTAGE/HYSTEROSCOPY, POLYPECTOMY WITH MYOSURE;  Surgeon: Hoit Rexene, MD;  Location: [facility];  Service: Gynecology;  Laterality: N/A;   OPEN REDUCTION INTERNAL FIXATION (ORIF) DISTAL RADIAL FRACTURE Right 04/19/2018   Procedure: OPEN TREATMENT OF RIGHT DISTAL RADIUS;  Surgeon: Sebastian Lenis, MD;  Location: [facility];  Service: Orthopedics;  Laterality: Right;   Patient Active Problem List   Diagnosis Date Noted   Genetic testing 08/31/2023   Family history of ovarian cancer 08/19/2023   Family history of breast cancer    Malignant neoplasm of upper-inner quadrant of left breast in female, estrogen receptor positive (HCC) 08/17/2023   Postmenopausal bleeding 09/26/2015   Endometrial polyp 09/26/2015    REFERRING DIAG: left breast cancer at risk for lymphedema  THERAPY DIAG: Aftercare following surgery for neoplasm  PERTINENT HISTORY: Post left lumpectomy with SLNB on 09/02/23 with 6 negative lymph nodes removed. Will have radiation.  Patient was diagnosed  on 07/20/2023 with left grade 2 invasive ductal carcinoma breast cancer. It measures 1.1 cm and is located in the upper inner quadrant. It is ER/PR positive and HER2 negative with a Ki67 of 1%. She reports that she has autism, diabetes and is on Ozempic for weight loss.   PRECAUTIONS: left UE Lymphedema risk, None  SUBJECTIVE: Pt returns for her first 3 month L-Dex screen. I had to go to [facility] urgent care over the weekend because my seroma that opened during radiation started getting infected. I already let my cancer docs know.  PAIN:  Are you having pain? No, breast is  just tender  SOZO SCREENING: Patient was assessed today using the SOZO machine to determine the lymphedema index score. This was compared to her baseline score. It was determined that she is within the recommended range when compared to her baseline and no further action is needed at this time. She will continue SOZO screenings. These are done every 3 months for 2 years post operatively followed by every 6 months for 2 years, and then annually.   L-DEX FLOWSHEETS - 11/23/23 1000       L-DEX LYMPHEDEMA SCREENING   Measurement Type Unilateral    L-DEX MEASUREMENT EXTREMITY Upper Extremity    POSITION  Standing    DOMINANT SIDE Right    At Risk Side Left    BASELINE SCORE (UNILATERAL) 0.7    L-DEX SCORE (UNILATERAL) 1.9    VALUE CHANGE (UNILAT) 1.2            Aden Berwyn Caldron, PTA 11/23/2023, 10:52 AM

## 2023-12-11 NOTE — Progress Notes (Addendum)
  Radiation Oncology         [phone] ________________________________  Name: Tara Johnson MRN: [id number]  Date of Service: 12/14/2023  DOB: August 24, 1960  Post Treatment Telephone Note  Diagnosis:  Stage IA, pT1cN0M0, grade 2, ER/PR positive invasive ductal carcinoma of the left breast    The patient was available for call today.   Symptoms of fatigue have improved since completing therapy.  Symptoms of skin changes have improved since completing therapy.   The patient was encouraged to avoid sun exposure in the area of prior treatment for up to one year following radiation with either sunscreen or by the style of clothing worn in the sun.  The patient has scheduled follow up with her medical oncologist Dr. Lanny for ongoing surveillance, and was encouraged to call if she develops concerns or questions regarding radiation.    First Treatment Date: 2023-10-19 Last Treatment Date: 2023-11-13   Plan Name: Breast_L Site: Breast, Left Technique: 3D Mode: Photon Dose Per Fraction: 2.66 Gy Prescribed Dose (Delivered / Prescribed): 42.56 Gy / 42.56 Gy Prescribed Fxs (Delivered / Prescribed): 16 / 16   Plan Name: Breast_L_Bst Site: Breast, Left Technique: 3D Mode: Photon Dose Per Fraction: 2 Gy Prescribed Dose (Delivered / Prescribed): 8 Gy / 8 Gy Prescribed Fxs (Delivered / Prescribed): 4 / 4

## 2023-12-14 ENCOUNTER — Ambulatory Visit
Admission: RE | Admit: 2023-12-14 | Discharge: 2023-12-14 | Disposition: A | Discharge status: Home / Self Care | Source: Ambulatory Visit | Attending: Radiation Oncology | Admitting: Radiation Oncology

## 2023-12-14 DIAGNOSIS — Z170 Estrogen receptor positive status [ER+]: Secondary | ICD-10-CM

## 2023-12-21 NOTE — Addendum Note (Signed)
 Encounter addended by: Albino Dyke FALCON, LPN on: 89/05/7972 12:48 PM  Actions taken: Clinical Note Signed

## 2024-01-18 ENCOUNTER — Telehealth: Payer: Self-pay | Admitting: Adult Health

## 2024-01-18 NOTE — Telephone Encounter (Signed)
 left vm for pt about rescheduled appt date and time due to provider being out of office. Encouraged to call back if need to re-reschedule

## 2024-01-26 ENCOUNTER — Inpatient Hospital Stay: Admitting: Adult Health

## 2024-02-04 ENCOUNTER — Inpatient Hospital Stay

## 2024-02-04 ENCOUNTER — Encounter: Payer: Self-pay | Admitting: Adult Health

## 2024-02-04 ENCOUNTER — Inpatient Hospital Stay: Attending: Hematology | Admitting: Adult Health

## 2024-02-04 VITALS — BP 136/72 | HR 87 | Temp 97.6°F | Resp 18 | Ht 64.0 in | Wt 242.6 lb

## 2024-02-04 DIAGNOSIS — Z170 Estrogen receptor positive status [ER+]: Secondary | ICD-10-CM | POA: Diagnosis not present

## 2024-02-04 DIAGNOSIS — C50212 Malignant neoplasm of upper-inner quadrant of left female breast: Secondary | ICD-10-CM

## 2024-02-04 DIAGNOSIS — M8589 Other specified disorders of bone density and structure, multiple sites: Secondary | ICD-10-CM

## 2024-02-04 LAB — GUARDANT REVEAL

## 2024-02-04 NOTE — Progress Notes (Signed)
 SURVIVORSHIP VISIT:  BRIEF ONCOLOGIC HISTORY:  Oncology History  Malignant neoplasm of upper-inner quadrant of left breast in female, estrogen receptor positive (HCC)  08/07/2023 Cancer Staging   Staging form: Breast, AJCC 8th Edition - Clinical stage from 08/07/2023: Stage IA (cT1c, cN0, cM0, G2, ER+, PR+, HER2-) - Signed by Lanny Callander, MD on 08/18/2023 Stage prefix: Initial diagnosis Histologic grading system: 3 grade system   08/17/2023 Initial Diagnosis   Malignant neoplasm of upper-inner quadrant of left breast in female, estrogen receptor positive (HCC)   08/31/2023 Genetic Testing   Negative genetic testing on the CancerNext+RNAinsight panel.  The report date is August 29, 2023.  The [facility] CancerNext+RNAinsight Panel includes sequencing, rearrangement analysis, and RNA analysis for the following 40 genes: APC, ATM, BAP1, BARD1, BMPR1A, BRCA1, BRCA2, BRIP1, CDH1, CDKN2A, CHEK2, FH, FLCN, MET, MLH1, MSH2, MSH6, MUTYH, NF1, NTHL1, PALB2, PMS2, PTEN, RAD51C, RAD51D, RPS20, SMAD4, STK11, TP53, TSC1, TSC2 and VHL (sequencing and deletion/duplication); AXIN2, HOXB13, MBD4, MSH3, POLD1 and POLE (sequencing only); EPCAM and GREM1 (deletion/duplication only). RNA data is routinely analyzed for use in variant interpretation for all genes.    10/05/2023 Cancer Staging   Staging form: Breast, AJCC 8th Edition - Pathologic stage from 10/05/2023: Stage IA (pT1c, pN0(sn), cM0, G2, ER+, PR+, HER2-, Oncotype DX score: 9) - Signed by Lanell Donald Stagger, PA-C on 10/05/2023 Stage prefix: Initial diagnosis Method of lymph node assessment: Sentinel lymph node biopsy Multigene prognostic tests performed: Oncotype DX Recurrence score range: Less than 11 Histologic grading system: 3 grade system   10/19/2023 - 11/13/2023 Radiation Therapy   Plan Name: Breast_L Site: Breast, Left Technique: 3D Mode: Photon Dose Per Fraction: 2.66 Gy Prescribed Dose (Delivered / Prescribed): 42.56 Gy / 42.56 Gy Prescribed Fxs  (Delivered / Prescribed): 16 / 16   Plan Name: Breast_L_Bst Site: Breast, Left Technique: 3D Mode: Photon Dose Per Fraction: 2 Gy Prescribed Dose (Delivered / Prescribed): 8 Gy / 8 Gy Prescribed Fxs (Delivered / Prescribed): 4 / 4   10/2023 -  Anti-estrogen oral therapy   Anastrozole      INTERVAL HISTORY:  Discussed the use of AI scribe software for clinical note transcription with the patient, who gave verbal consent to proceed.  History of Present Illness Tara Johnson is a 63 year old female with breast cancer who presents with concerns about hot flashes and sleep disturbances related to anastrozole  use.  She experiences significant hot flashes as a side effect of anastrozole , taken in the morning. These hot flashes are more frequent and severe than those during menopause, primarily occurring in the evening and disrupting sleep. She wakes up every hour or half hour but does not feel tired during the day, although she sometimes naps after work.  She has stage 1A estrogen-positive breast cancer, treated with lumpectomy and radiation therapy. Lymph nodes were negative, and chemotherapy was not required due to a low Oncotype DX score. She is currently on anastrozole . Occasional sharp breast pain is decreasing in frequency.  Family history includes significant ovarian cancer, with negative genetic testing. She has a daughter and granddaughter considered in the family history context.  She has lost over seventy pounds through dietary changes and increased physical activity, including walking around her office multiple times a day.   In the review of symptoms, she experiences shortness of breath related to asthma and scoliosis. No chest pain or heart disease. A history of tiredness prior to breast cancer diagnosis improved post-lumpectomy and antibiotic treatment for a seroma.  REVIEW OF SYSTEMS:  Review of Systems  Constitutional:  Negative for appetite change, chills, fatigue, fever  and unexpected weight change.  HENT:   Negative for hearing loss, lump/mass and trouble swallowing.   Eyes:  Negative for eye problems and icterus.  Respiratory:  Negative for chest tightness, cough and shortness of breath.   Cardiovascular:  Negative for chest pain, leg swelling and palpitations.  Gastrointestinal:  Negative for abdominal distention, abdominal pain, constipation, diarrhea, nausea and vomiting.  Endocrine: Negative for hot flashes.  Genitourinary:  Negative for difficulty urinating.   Musculoskeletal:  Negative for arthralgias.  Skin:  Negative for itching and rash.  Neurological:  Negative for dizziness, extremity weakness, headaches and numbness.  Hematological:  Negative for adenopathy. Does not bruise/bleed easily.  Psychiatric/Behavioral:  Negative for depression. The patient is not nervous/anxious.    Breast: Denies any new nodularity, masses, tenderness, nipple changes, or nipple discharge.       PAST MEDICAL/SURGICAL HISTORY:  Past Medical History:  Diagnosis Date   Arthritis    lower back and bilateral knees   Asperger's syndrome    High functioning   Asthma    associated with upper respiratory infection use inhaler as needed   Autism    high functioning   Breast cancer (HCC) 07/2023   left breast IDC   Cancer (HCC)    precancerous lesions on skin 20 plus   Chronic back pain    Closed fracture of right distal radius    Complication of anesthesia    prolonged sedation   Diabetes mellitus without complication (HCC)    Diabetic neuropathy (HCC)    Family history of breast cancer    Family history of ovarian cancer    Hip pain    Hypertension    Hypothyroidism    Osteopenia    Thyroid  disease    Vitamin D deficiency    Past Surgical History:  Procedure Laterality Date   BREAST BIOPSY     years ago unsure of side   BREAST BIOPSY Left 08/07/2023   US  LT BREAST BX W LOC DEV 1ST LESION IMG BX SPEC US  GUIDE 08/07/2023 [facility]   BREAST  BIOPSY Left 09/01/2023   US  LT RADIOACTIVE SEED LOC 09/01/2023 [facility]   BREAST LUMPECTOMY WITH RADIOACTIVE SEED AND SENTINEL LYMPH NODE BIOPSY Left 09/02/2023   Procedure: BREAST LUMPECTOMY WITH RADIOACTIVE SEED AND SENTINEL LYMPH NODE BIOPSY;  Surgeon: Curvin Deward MOULD, MD;  Location: [facility];  Service: General;  Laterality: Left;  GEN w/PEC BLOCK LEFT BREAST RADIOACTIVE SEED LOCALIZED LUMPECTOMY SENTINEL NODE BIOPSY   CHOLECYSTECTOMY     DILATATION & CURETTAGE/HYSTEROSCOPY WITH MYOSURE N/A 09/26/2015   Procedure: DILATATION & CURETTAGE/HYSTEROSCOPY WITH MYOSURE;  Surgeon: Rexene Hoit, MD;  Location: [facility];  Service: Gynecology;  Laterality: N/A;  Polypectomy   DILATATION & CURETTAGE/HYSTEROSCOPY WITH MYOSURE N/A 10/12/2019   Procedure: DILATATION & CURETTAGE/HYSTEROSCOPY, POLYPECTOMY WITH MYOSURE;  Surgeon: Hoit Rexene, MD;  Location: [facility];  Service: Gynecology;  Laterality: N/A;   OPEN REDUCTION INTERNAL FIXATION (ORIF) DISTAL RADIAL FRACTURE Right 04/19/2018   Procedure: OPEN TREATMENT OF RIGHT DISTAL RADIUS;  Surgeon: Sebastian Lenis, MD;  Location: [facility];  Service: Orthopedics;  Laterality: Right;     ALLERGIES:  Allergies  Allergen Reactions   Metformin And Related Diarrhea    Nausea   CURRENT MEDICATIONS:  Outpatient Encounter Medications as of 02/04/2024  Medication Sig   albuterol (PROVENTIL HFA;VENTOLIN HFA) 108 (90 Base)  MCG/ACT inhaler Inhale into the lungs every 6 (six) hours as needed for wheezing or shortness of breath.   amLODipine (NORVASC) 5 MG tablet Take 5 mg by mouth daily.   anastrozole  (ARIMIDEX ) 1 MG tablet Take 1 tablet (1 mg total) by mouth daily.   Fluticasone Furoate (ARNUITY ELLIPTA) 100 MCG/ACT AEPB Inhale 1 puff into the lungs daily.   glimepiride (AMARYL) 4 MG tablet Take 4 mg by mouth daily with breakfast.   glucose blood test strip 1 each by Other route as needed for other. Use as instructed    levothyroxine (SYNTHROID) 112 MCG tablet Take 112 mcg by mouth daily.   lisinopril (PRINIVIL,ZESTRIL) 20 MG tablet Take 20 mg by mouth daily.   methocarbamol (ROBAXIN) 500 MG tablet Take 500 mg by mouth every 6 (six) hours as needed for muscle spasms.   nystatin  (MYCOSTATIN /NYSTOP ) powder Apply 1 Application topically 2 (two) times daily. To axillary skin rashes until rashes clear   rosuvastatin (CRESTOR) 10 MG tablet Take 10 mg by mouth daily.   Semaglutide (OZEMPIC, 2 MG/DOSE, [place]) Inject 2 mg into the skin once a week.   Vitamin D, Ergocalciferol, (DRISDOL) 1.25 MG (50000 UNIT) CAPS capsule Take 50,000 Units by mouth every 7 (seven) days.   No facility-administered encounter medications on file as of 02/04/2024.   ONCOLOGIC FAMILY HISTORY:  Family History  Problem Relation Age of Onset   Ovarian cancer Mother 44 - 101   Brain cancer Mother    Ovarian cancer Maternal Aunt 60 - 69   Ovarian cancer Maternal Aunt 57 - 69   Brain cancer Paternal Aunt    Cancer Paternal Aunt        unknown   Ovarian cancer Maternal Grandmother        early 53s   Liver cancer Paternal Grandmother    Breast cancer Cousin 66 - 59   Liver cancer Cousin    Brain cancer Cousin    Bone cancer Cousin    Cancer Cousin        thymoma   SOCIAL HISTORY:  Social History   Socioeconomic History   Marital status: Married    Spouse name: Not on file   Number of children: 1   Years of education: Not on file   Highest education level: Not on file  Occupational History   Not on file  Tobacco Use   Smoking status: Never   Smokeless tobacco: Never  Substance and Sexual Activity   Alcohol use: Yes    Comment: rarely   Drug use: No   Sexual activity: Yes    Birth control/protection: Post-menopausal  Other Topics Concern   Not on file  Social History Narrative   Not on file   Social Drivers of Health   Financial Resource Strain: Not on file  Food Insecurity: No Food Insecurity (11/10/2023)   Hunger  Vital Sign    Worried About Running Out of Food in the Last Year: Never true    Ran Out of Food in the Last Year: Never true  Transportation Needs: No Transportation Needs (11/10/2023)   PRAPARE - Administrator, Civil Service (Medical): No    Lack of Transportation (Non-Medical): No  Physical Activity: Not on file  Stress: Not on file  Social Connections: Not on file  Intimate Partner Violence: Not At Risk (11/10/2023)   Humiliation, Afraid, Rape, and Kick questionnaire    Fear of Current or Ex-Partner: No    Emotionally Abused: No  Physically Abused: No    Sexually Abused: No     OBSERVATIONS/OBJECTIVE:  BP 136/72 (BP Location: Left Arm, Patient Position: Sitting)   Pulse 87   Temp 97.6 F (36.4 C) (Temporal)   Resp 18   Ht 5' 4 (1.626 m)   Wt 242 lb 9.6 oz (110 kg)   SpO2 100%   BMI 41.64 kg/m  GENERAL: Patient is a well appearing female in no acute distress HEENT:  Sclerae anicteric.  Oropharynx clear and moist. No ulcerations or evidence of oropharyngeal candidiasis. Neck is supple.  NODES:  No cervical, supraclavicular, or axillary lymphadenopathy palpated.  BREAST EXAM:  left breast s/p lumpectomy and radiation, no sign of local recurrence LUNGS:  Clear to auscultation bilaterally.  No wheezes or rhonchi. HEART:  Regular rate and rhythm. No murmur appreciated. ABDOMEN:  Soft, nontender.  Positive, normoactive bowel sounds. No organomegaly palpated. MSK:  No focal spinal tenderness to palpation. Full range of motion bilaterally in the upper extremities. EXTREMITIES:  No peripheral edema.   SKIN:  Clear with no obvious rashes or skin changes. No nail dyscrasia. NEURO:  Nonfocal. Well oriented.  Appropriate affect.   LABORATORY DATA:  None for this visit.  DIAGNOSTIC IMAGING:  None for this visit.      ASSESSMENT AND PLAN:  Ms.. Tara Johnson is a pleasant 63 y.o. female with Stage IA left breast invasive ductal carcinoma, ER+/PR+/HER2-, diagnosed in  07/2023, treated with lumpectomy, adjuvant radiation therapy, and anti-estrogen therapy with Anastrozole  beginning in 10/2023.  She presents to the Survivorship Clinic for our initial meeting and routine follow-up post-completion of treatment for breast cancer.    1. Stage IA left breast cancer:  Ms. Mandeville is continuing to recover from definitive treatment for breast cancer. She will follow-up with her medical oncologist, Dr.  Lanny in 3 months with history and physical exam per surveillance protocol.  She will continue her anti-estrogen therapy with Anastrozole . Thus far, she is tolerating the Anastrozole  well, with minimal side effects. Her mammogram is due 07/2024; orders placed today.   Today, a comprehensive survivorship care plan and treatment summary was reviewed with the patient today detailing her breast cancer diagnosis, treatment course, potential late/long-term effects of treatment, appropriate follow-up care with recommendations for the future, and patient education resources.  A copy of this summary, along with a letter will be sent to the patient's primary care provider via mail/fax/In Basket message after today's visit.    2. Bone health:  Given Ms. Kusek's age/history of breast cancer and her current treatment regimen including anti-estrogen therapy with anastrozole , she is at risk for bone demineralization.  She is due for bone density testing and I placed orders today. She was given education on specific activities to promote bone health.  3. Cancer screening:  Due to Ms. Virag's history and her age, she should receive screening for skin cancers, colon cancer, and gynecologic cancers.  The information and recommendations are listed on the patient's comprehensive care plan/treatment summary and were reviewed in detail with the patient.    4. Health maintenance and wellness promotion: Ms. Velasquez was encouraged to consume 5-7 servings of fruits and vegetables per day. We reviewed the Nutrition  Rainbow handout.  She was also encouraged to engage in moderate to vigorous exercise for 30 minutes per day most days of the week.  She was instructed to limit her alcohol consumption and continue to abstain from tobacco use.     5. Support services/counseling: It is not uncommon for  this period of the patient's cancer care trajectory to be one of many emotions and stressors.   She was given information regarding our available services and encouraged to contact me with any questions or for help enrolling in any of our support group/programs.    Follow up instructions:    -Return to cancer center in 3 months for f/u with Dr. Lanny  -Mammogram due in 06/2024 -Bone density testing in 02/2024 -She is welcome to return back to the Survivorship Clinic at any time; no additional follow-up needed at this time.  -Consider referral back to survivorship as a long-term survivor for continued surveillance  The patient was provided an opportunity to ask questions and all were answered. The patient agreed with the plan and demonstrated an understanding of the instructions.   Total encounter time:45 minutes*in face-to-face visit time, chart review, lab review, care coordination, order entry, and documentation of the encounter time.    Morna Kendall, NP 02/04/24 10:35 AM Medical Oncology and Hematology [facility] [address] [place], [place] [address] Tel. [phone]    Fax. [phone]  *Total Encounter Time as defined by the Centers for Medicare and Medicaid Services includes, in addition to the face-to-face time of a patient visit (documented in the note above) non-face-to-face time: obtaining and reviewing outside history, ordering and reviewing medications, tests or procedures, care coordination (communications with other health care professionals or caregivers) and documentation in the medical record.

## 2024-02-05 ENCOUNTER — Telehealth: Payer: Self-pay | Admitting: Hematology

## 2024-02-05 NOTE — Telephone Encounter (Signed)
 left vm for pt about schedule lab only appt date and time. Encouraged to call back with any questions or concerns.. reminder letter sent

## 2024-02-16 ENCOUNTER — Encounter: Payer: Self-pay | Admitting: Adult Health

## 2024-02-22 ENCOUNTER — Ambulatory Visit: Attending: General Surgery

## 2024-02-22 VITALS — Wt 240.2 lb

## 2024-02-22 DIAGNOSIS — Z483 Aftercare following surgery for neoplasm: Secondary | ICD-10-CM | POA: Insufficient documentation

## 2024-02-22 NOTE — Therapy (Signed)
 OUTPATIENT PHYSICAL THERAPY SOZO SCREENING NOTE   Patient Name: Tara Johnson MRN: [id number] DOB:1960-11-29, 63 y.o., female Today's Date: 02/22/2024  PCP: Dwight Trula SHAUNNA, MD REFERRING PROVIDER: Curvin Deward MOULD, MD   PT End of Session - 02/22/24 1046     Visit Number 7   # unchanged due to screen only   PT Start Time 1044    PT Stop Time 1053    PT Time Calculation (min) 9 min    Activity Tolerance Patient tolerated treatment well    Behavior During Therapy WFL for tasks assessed/performed          Past Medical History:  Diagnosis Date   Arthritis    lower back and bilateral knees   Asperger's syndrome    High functioning   Asthma    associated with upper respiratory infection use inhaler as needed   Autism    high functioning   Breast cancer (HCC) 07/2023   left breast IDC   Cancer (HCC)    precancerous lesions on skin 20 plus   Chronic back pain    Closed fracture of right distal radius    Complication of anesthesia    prolonged sedation   Diabetes mellitus without complication (HCC)    Diabetic neuropathy (HCC)    Family history of breast cancer    Family history of ovarian cancer    Hip pain    Hypertension    Hypothyroidism    Osteopenia    Thyroid  disease    Vitamin D deficiency    Past Surgical History:  Procedure Laterality Date   BREAST BIOPSY     years ago unsure of side   BREAST BIOPSY Left 08/07/2023   US  LT BREAST BX W LOC DEV 1ST LESION IMG BX SPEC US  GUIDE 08/07/2023 [facility]   BREAST BIOPSY Left 09/01/2023   US  LT RADIOACTIVE SEED LOC 09/01/2023 [facility]   BREAST LUMPECTOMY WITH RADIOACTIVE SEED AND SENTINEL LYMPH NODE BIOPSY Left 09/02/2023   Procedure: BREAST LUMPECTOMY WITH RADIOACTIVE SEED AND SENTINEL LYMPH NODE BIOPSY;  Surgeon: Curvin Deward MOULD, MD;  Location: [facility];  Service: General;  Laterality: Left;  GEN w/PEC BLOCK LEFT BREAST RADIOACTIVE SEED LOCALIZED LUMPECTOMY SENTINEL NODE BIOPSY    CHOLECYSTECTOMY     DILATATION & CURETTAGE/HYSTEROSCOPY WITH MYOSURE N/A 09/26/2015   Procedure: DILATATION & CURETTAGE/HYSTEROSCOPY WITH MYOSURE;  Surgeon: Rexene Hoit, MD;  Location: [facility];  Service: Gynecology;  Laterality: N/A;  Polypectomy   DILATATION & CURETTAGE/HYSTEROSCOPY WITH MYOSURE N/A 10/12/2019   Procedure: DILATATION & CURETTAGE/HYSTEROSCOPY, POLYPECTOMY WITH MYOSURE;  Surgeon: Hoit Rexene, MD;  Location: [facility];  Service: Gynecology;  Laterality: N/A;   OPEN REDUCTION INTERNAL FIXATION (ORIF) DISTAL RADIAL FRACTURE Right 04/19/2018   Procedure: OPEN TREATMENT OF RIGHT DISTAL RADIUS;  Surgeon: Sebastian Lenis, MD;  Location: [facility];  Service: Orthopedics;  Laterality: Right;   Patient Active Problem List   Diagnosis Date Noted   Genetic testing 08/31/2023   Family history of ovarian cancer 08/19/2023   Family history of breast cancer    Malignant neoplasm of upper-inner quadrant of left breast in female, estrogen receptor positive (HCC) 08/17/2023   Postmenopausal bleeding 09/26/2015   Endometrial polyp 09/26/2015    REFERRING DIAG: left breast cancer at risk for lymphedema  THERAPY DIAG: Aftercare following surgery for neoplasm  PERTINENT HISTORY: Post left lumpectomy with SLNB on 09/02/23 with 6 negative lymph nodes removed. Will have radiation.  Patient was diagnosed  on 07/20/2023 with left grade 2 invasive ductal carcinoma breast cancer. It measures 1.1 cm and is located in the upper inner quadrant. It is ER/PR positive and HER2 negative with a Ki67 of 1%. She reports that she has autism, diabetes and is on Ozempic for weight loss.   PRECAUTIONS: left UE Lymphedema risk, None  SUBJECTIVE: Pt returns for her 3 month L-Dex screen.   PAIN:  Are you having pain? No  SOZO SCREENING: Patient was assessed today using the SOZO machine to determine the lymphedema index score. This was compared to her baseline score. It was determined that she  is within the recommended range when compared to her baseline and no further action is needed at this time. She will continue SOZO screenings. These are done every 3 months for 2 years post operatively followed by every 6 months for 2 years, and then annually.   L-DEX FLOWSHEETS - 02/22/24 1000       L-DEX LYMPHEDEMA SCREENING   Measurement Type Unilateral    L-DEX MEASUREMENT EXTREMITY Upper Extremity    POSITION  Standing    DOMINANT SIDE Right    At Risk Side Left    BASELINE SCORE (UNILATERAL) 0.7    L-DEX SCORE (UNILATERAL) -3.2    VALUE CHANGE (UNILAT) -3.9         P: Cont every 3 month L-Dex screens until 08/2025 then transition to every 6 months until 08/2027.    Aden Berwyn Caldron, PTA 02/22/2024, 10:53 AM

## 2024-03-11 ENCOUNTER — Other Ambulatory Visit: Payer: Self-pay | Admitting: Hematology

## 2024-04-28 ENCOUNTER — Inpatient Hospital Stay: Admitting: Hematology

## 2024-04-28 ENCOUNTER — Inpatient Hospital Stay: Attending: Hematology

## 2024-05-23 ENCOUNTER — Ambulatory Visit

## 2024-08-04 ENCOUNTER — Inpatient Hospital Stay
